# Patient Record
Sex: Female | Born: 1965 | Race: White | Hispanic: No | Marital: Married | Smoking: Current every day smoker
Health system: Southern US, Community
[De-identification: ages and names within clinical notes are randomized; demographics above are authoritative.]

## PROBLEM LIST (undated history)

## (undated) DIAGNOSIS — E785 Hyperlipidemia, unspecified: Secondary | ICD-10-CM

## (undated) DIAGNOSIS — N6310 Unspecified lump in the right breast, unspecified quadrant: Secondary | ICD-10-CM

## (undated) HISTORY — PX: KNEE SURGERY: SHX244

## (undated) HISTORY — DX: Hyperlipidemia, unspecified: E78.5

## (undated) HISTORY — PX: KNEE ARTHROSCOPY: SUR90

---

## 1996-01-22 HISTORY — PX: KNEE SURGERY: SHX244

## 1997-03-24 ENCOUNTER — Inpatient Hospital Stay (HOSPITAL_COMMUNITY): Admission: AD | Admit: 1997-03-24 | Discharge: 1997-03-24 | Payer: Self-pay | Admitting: Gynecology

## 1997-04-02 ENCOUNTER — Inpatient Hospital Stay (HOSPITAL_COMMUNITY): Admission: AD | Admit: 1997-04-02 | Discharge: 1997-04-05 | Payer: Self-pay | Admitting: Obstetrics and Gynecology

## 1997-04-06 ENCOUNTER — Encounter: Admission: RE | Admit: 1997-04-06 | Discharge: 1997-07-05 | Payer: Self-pay | Admitting: *Deleted

## 1997-05-24 ENCOUNTER — Other Ambulatory Visit: Admission: RE | Admit: 1997-05-24 | Discharge: 1997-05-24 | Payer: Self-pay | Admitting: Gynecology

## 1997-07-06 ENCOUNTER — Encounter (HOSPITAL_COMMUNITY): Admission: RE | Admit: 1997-07-06 | Discharge: 1997-10-04 | Payer: Self-pay | Admitting: *Deleted

## 1998-09-12 ENCOUNTER — Other Ambulatory Visit: Admission: RE | Admit: 1998-09-12 | Discharge: 1998-09-12 | Payer: Self-pay | Admitting: Gynecology

## 1998-10-20 ENCOUNTER — Other Ambulatory Visit: Admission: RE | Admit: 1998-10-20 | Discharge: 1998-10-20 | Payer: Self-pay | Admitting: Gynecology

## 1998-11-17 ENCOUNTER — Encounter: Admission: RE | Admit: 1998-11-17 | Discharge: 1998-11-17 | Payer: Self-pay | Admitting: *Deleted

## 1998-11-17 ENCOUNTER — Encounter: Payer: Self-pay | Admitting: *Deleted

## 1999-02-06 ENCOUNTER — Other Ambulatory Visit: Admission: RE | Admit: 1999-02-06 | Discharge: 1999-02-06 | Payer: Self-pay | Admitting: Gynecology

## 1999-08-29 ENCOUNTER — Emergency Department (HOSPITAL_COMMUNITY): Admission: EM | Admit: 1999-08-29 | Discharge: 1999-08-29 | Payer: Self-pay | Admitting: Internal Medicine

## 1999-09-05 ENCOUNTER — Encounter: Payer: Self-pay | Admitting: Internal Medicine

## 1999-09-05 ENCOUNTER — Emergency Department (HOSPITAL_COMMUNITY): Admission: EM | Admit: 1999-09-05 | Discharge: 1999-09-05 | Payer: Self-pay | Admitting: Emergency Medicine

## 2000-02-29 ENCOUNTER — Other Ambulatory Visit: Admission: RE | Admit: 2000-02-29 | Discharge: 2000-02-29 | Payer: Self-pay | Admitting: Gynecology

## 2001-05-21 ENCOUNTER — Other Ambulatory Visit: Admission: RE | Admit: 2001-05-21 | Discharge: 2001-05-21 | Payer: Self-pay | Admitting: Gynecology

## 2003-05-02 ENCOUNTER — Other Ambulatory Visit: Admission: RE | Admit: 2003-05-02 | Discharge: 2003-05-02 | Payer: Self-pay | Admitting: Gynecology

## 2004-05-15 ENCOUNTER — Other Ambulatory Visit: Admission: RE | Admit: 2004-05-15 | Discharge: 2004-05-15 | Payer: Self-pay | Admitting: Gynecology

## 2005-08-22 ENCOUNTER — Other Ambulatory Visit: Admission: RE | Admit: 2005-08-22 | Discharge: 2005-08-22 | Payer: Self-pay | Admitting: Gynecology

## 2013-08-13 ENCOUNTER — Other Ambulatory Visit: Payer: Self-pay | Admitting: Obstetrics & Gynecology

## 2013-08-13 DIAGNOSIS — Z1231 Encounter for screening mammogram for malignant neoplasm of breast: Secondary | ICD-10-CM

## 2013-08-19 ENCOUNTER — Ambulatory Visit (INDEPENDENT_AMBULATORY_CARE_PROVIDER_SITE_OTHER): Payer: 59

## 2013-08-19 DIAGNOSIS — Z1231 Encounter for screening mammogram for malignant neoplasm of breast: Secondary | ICD-10-CM

## 2013-09-02 ENCOUNTER — Ambulatory Visit (INDEPENDENT_AMBULATORY_CARE_PROVIDER_SITE_OTHER): Payer: 59 | Admitting: Obstetrics & Gynecology

## 2013-09-02 ENCOUNTER — Encounter: Payer: Self-pay | Admitting: Obstetrics & Gynecology

## 2013-09-02 VITALS — BP 113/72 | HR 85 | Resp 16 | Ht 62.0 in | Wt 140.0 lb

## 2013-09-02 DIAGNOSIS — Z124 Encounter for screening for malignant neoplasm of cervix: Secondary | ICD-10-CM

## 2013-09-02 DIAGNOSIS — Z1151 Encounter for screening for human papillomavirus (HPV): Secondary | ICD-10-CM

## 2013-09-02 DIAGNOSIS — Z01419 Encounter for gynecological examination (general) (routine) without abnormal findings: Secondary | ICD-10-CM

## 2013-09-02 DIAGNOSIS — Z0000 Encounter for general adult medical examination without abnormal findings: Secondary | ICD-10-CM

## 2013-09-02 NOTE — Progress Notes (Signed)
Subjective:    Catherine Cantrell is a 48 y.o. MW P2(28 and 31 yo sons) female who presents for an annual exam. The patient has no complaints today. The patient is sexually active. GYN screening history: last pap: was normal. The patient wears seatbelts: yes. The patient participates in regular exercise: yes. Has the patient ever been transfused or tattooed?: yes. (tattoo)  The patient reports that there is not domestic violence in her life.   Menstrual History: OB History   Grav Para Term Preterm Abortions TAB SAB Ect Mult Living   3 2   1 1    2       Menarche age: 11  Patient's last menstrual period was 08/19/2013.    The following portions of the patient's history were reviewed and updated as appropriate: allergies, current medications, past family history, past medical history, past social history, past surgical history and problem list.  Review of Systems A comprehensive review of systems was negative.  Married for 20 years, some new onset dryness/roughness with sex, using astroglide. Her husband has had a vasectomy and has bladder cancer and so sex is rare. Mammogram normal and utd. 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)   Objective:    BP 113/72  Pulse 85  Resp 16  Ht 5\' 2"  (1.575 m)  Wt 140 lb (63.504 kg)  BMI 25.60 kg/m2  LMP 08/19/2013  General Appearance:    Alert, cooperative, no distress, appears stated age  Head:    Normocephalic, without obvious abnormality, atraumatic  Eyes:    PERRL, conjunctiva/corneas clear, EOM's intact, fundi    benign, both eyes  Ears:    Normal TM's and external ear canals, both ears  Nose:   Nares normal, septum midline, mucosa normal, no drainage    or sinus tenderness  Throat:   Lips, mucosa, and tongue normal; teeth and gums normal  Neck:   Supple, symmetrical, trachea midline, no adenopathy;    thyroid:  no enlargement/tenderness/nodules; no carotid   bruit or JVD  Back:     Symmetric, no curvature, ROM normal, no CVA tenderness   Lungs:     Clear to auscultation bilaterally, respirations unlabored  Chest Wall:    No tenderness or deformity   Heart:    Regular rate and rhythm, S1 and S2 normal, no murmur, rub   or gallop  Breast Exam:    No tenderness, masses, or nipple abnormality  Abdomen:     Soft, non-tender, bowel sounds active all four quadrants,    no masses, no organomegaly  Genitalia:    Normal female without lesion, discharge or tenderness, hypo and hyperpigmented areas of vulva (She says that this is unchanged for 15 years and has had multiple biopsies), NSSR, NT, mobile, normal adnexal exam     Extremities:   Extremities normal, atraumatic, no cyanosis or edema  Pulses:   2+ and symmetric all extremities  Skin:   Skin color, texture, turgor normal, no rashes or lesions  Lymph nodes:   Cervical, supraclavicular, and axillary nodes normal  Neurologic:   CNII-XII intact, normal strength, sensation and reflexes    throughout  .    Assessment:    Healthy female exam.    Plan:     Breast self exam technique reviewed and patient encouraged to perform self-exam monthly. Thin prep Pap smear.  Refer to [facility] for screening labs

## 2013-09-06 LAB — PAP LB, RFX HPV ALL PTH: PAP Smear Comment: 0

## 2013-09-08 ENCOUNTER — Telehealth: Payer: Self-pay | Admitting: *Deleted

## 2013-09-08 NOTE — Telephone Encounter (Signed)
LMOVM of neg pap smear.

## 2013-11-22 ENCOUNTER — Encounter: Payer: Self-pay | Admitting: Obstetrics & Gynecology

## 2013-11-24 DIAGNOSIS — E785 Hyperlipidemia, unspecified: Secondary | ICD-10-CM | POA: Insufficient documentation

## 2014-09-30 ENCOUNTER — Other Ambulatory Visit (HOSPITAL_COMMUNITY): Payer: Self-pay | Admitting: Obstetrics & Gynecology

## 2014-09-30 DIAGNOSIS — Z1231 Encounter for screening mammogram for malignant neoplasm of breast: Secondary | ICD-10-CM

## 2014-10-11 ENCOUNTER — Ambulatory Visit: Payer: Self-pay | Admitting: Obstetrics & Gynecology

## 2014-10-13 ENCOUNTER — Ambulatory Visit (INDEPENDENT_AMBULATORY_CARE_PROVIDER_SITE_OTHER): Payer: 59 | Admitting: Obstetrics & Gynecology

## 2014-10-13 ENCOUNTER — Ambulatory Visit (INDEPENDENT_AMBULATORY_CARE_PROVIDER_SITE_OTHER): Payer: 59

## 2014-10-13 ENCOUNTER — Encounter: Payer: Self-pay | Admitting: Obstetrics & Gynecology

## 2014-10-13 VITALS — BP 129/80 | HR 81 | Resp 16 | Ht 62.5 in | Wt 148.0 lb

## 2014-10-13 DIAGNOSIS — Z1151 Encounter for screening for human papillomavirus (HPV): Secondary | ICD-10-CM

## 2014-10-13 DIAGNOSIS — Z01419 Encounter for gynecological examination (general) (routine) without abnormal findings: Secondary | ICD-10-CM | POA: Diagnosis not present

## 2014-10-13 DIAGNOSIS — N938 Other specified abnormal uterine and vaginal bleeding: Secondary | ICD-10-CM

## 2014-10-13 DIAGNOSIS — N898 Other specified noninflammatory disorders of vagina: Secondary | ICD-10-CM

## 2014-10-13 DIAGNOSIS — Z124 Encounter for screening for malignant neoplasm of cervix: Secondary | ICD-10-CM | POA: Diagnosis not present

## 2014-10-13 DIAGNOSIS — L298 Other pruritus: Secondary | ICD-10-CM | POA: Diagnosis not present

## 2014-10-13 DIAGNOSIS — Z1231 Encounter for screening mammogram for malignant neoplasm of breast: Secondary | ICD-10-CM | POA: Diagnosis not present

## 2014-10-13 MED ORDER — VARENICLINE TARTRATE 0.5 MG PO TABS: 0.5000 mg | ORAL_TABLET | Freq: Two times a day (BID) | ORAL | Status: DC

## 2014-10-13 NOTE — Progress Notes (Signed)
Subjective:    Catherine Cantrell is a 49 y.o. MW  female who presents for an annual exam. She reports 2 periods per month and some vaginal itching. The patient is rarely sexually active. GYN screening history: last pap: was normal. The patient wears seatbelts: yes. The patient participates in regular exercise: yes. Has the patient ever been transfused or tattooed?: yes. The patient reports that there is not domestic violence in her life.   Menstrual History: OB History    Gravida Para Term Preterm AB TAB SAB Ectopic Multiple Living   3 2   1 1    2       Menarche age: 89  Patient's last menstrual period was 09/29/2014.    The following portions of the patient's history were reviewed and updated as appropriate: allergies, current medications, past family history, past medical history, past social history, past surgical history and problem list.  Review of Systems A comprehensive review of systems was negative.  Married for 21 years.    Objective:    BP 129/80 mmHg  Pulse 81  Resp 16  Ht 5' 2.5" (1.588 m)  Wt 148 lb (67.132 kg)  BMI 26.62 kg/m2  LMP 09/29/2014  General Appearance:    Alert, cooperative, no distress, appears stated age  Head:    Normocephalic, without obvious abnormality, atraumatic  Eyes:    PERRL, conjunctiva/corneas clear, EOM's intact, fundi    benign, both eyes  Ears:    Normal TM's and external ear canals, both ears  Nose:   Nares normal, septum midline, mucosa normal, no drainage    or sinus tenderness  Throat:   Lips, mucosa, and tongue normal; teeth and gums normal  Neck:   Supple, symmetrical, trachea midline, no adenopathy;    thyroid:  no enlargement/tenderness/nodules; no carotid   bruit or JVD  Back:     Symmetric, no curvature, ROM normal, no CVA tenderness  Lungs:     Clear to auscultation bilaterally, respirations unlabored  Chest Wall:    No tenderness or deformity   Heart:    Regular rate and rhythm, S1 and S2 normal, no murmur, rub   or  gallop  Breast Exam:    No tenderness, masses, or nipple abnormality  Abdomen:     Soft, non-tender, bowel sounds active all four quadrants,    no masses, no organomegaly  Genitalia:    Normal female without lesion, discharge or tenderness, NSSA, NT, mobile, normal adnexal exam     Extremities:   Extremities normal, atraumatic, no cyanosis or edema  Pulses:   2+ and symmetric all extremities  Skin:   Skin color, texture, turgor normal, no rashes or lesions  Lymph nodes:   Cervical, supraclavicular, and axillary nodes normal  Neurologic:   CNII-XII intact, normal strength, sensation and reflexes    throughout  .    Assessment:    Healthy female exam.   She wants to restart Chantix to help stop smoking DUB Vaginal itching   Plan:     Breast self exam technique reviewed and patient encouraged to perform self-exam monthly. Thin prep Pap smear. with HPV Mammogram today Chantix (offered referred to [facility]) TSH and gyn u/s Wet prep

## 2014-10-14 LAB — TSH: TSH: 1.8 u[IU]/mL (ref 0.450–4.500)

## 2014-10-17 ENCOUNTER — Ambulatory Visit (INDEPENDENT_AMBULATORY_CARE_PROVIDER_SITE_OTHER): Payer: 59

## 2014-10-17 DIAGNOSIS — N938 Other specified abnormal uterine and vaginal bleeding: Secondary | ICD-10-CM

## 2014-10-17 LAB — WET PREP, GENITAL

## 2014-10-18 LAB — PAP IG AND HPV HIGH-RISK
HPV low volume reflex: NEGATIVE
PAP Smear Comment: 0

## 2014-11-16 ENCOUNTER — Emergency Department
Admission: EM | Admit: 2014-11-16 | Discharge: 2014-11-16 | Disposition: A | Discharge status: Home / Self Care | Payer: 59 | Source: Home / Self Care | Attending: Family Medicine | Admitting: Family Medicine

## 2014-11-16 ENCOUNTER — Encounter: Payer: Self-pay | Admitting: Emergency Medicine

## 2014-11-16 ENCOUNTER — Emergency Department (INDEPENDENT_AMBULATORY_CARE_PROVIDER_SITE_OTHER): Payer: 59

## 2014-11-16 DIAGNOSIS — J209 Acute bronchitis, unspecified: Secondary | ICD-10-CM | POA: Diagnosis not present

## 2014-11-16 DIAGNOSIS — R05 Cough: Secondary | ICD-10-CM | POA: Diagnosis not present

## 2014-11-16 DIAGNOSIS — R0989 Other specified symptoms and signs involving the circulatory and respiratory systems: Secondary | ICD-10-CM

## 2014-11-16 DIAGNOSIS — F17200 Nicotine dependence, unspecified, uncomplicated: Secondary | ICD-10-CM | POA: Diagnosis not present

## 2014-11-16 MED ORDER — BENZONATATE 100 MG PO CAPS: 100.0000 mg | ORAL_CAPSULE | Freq: Three times a day (TID) | ORAL | Status: DC

## 2014-11-16 MED ORDER — PREDNISONE 20 MG PO TABS: ORAL_TABLET | ORAL | Status: DC

## 2014-11-16 MED ORDER — AZITHROMYCIN 250 MG PO TABS: 250.0000 mg | ORAL_TABLET | Freq: Every day | ORAL | Status: DC

## 2014-11-16 MED ORDER — ALBUTEROL SULFATE HFA 108 (90 BASE) MCG/ACT IN AERS: 1.0000 | INHALATION_SPRAY | Freq: Four times a day (QID) | RESPIRATORY_TRACT | Status: DC | PRN

## 2014-11-16 MED ORDER — HYDROCODONE-HOMATROPINE 5-1.5 MG/5ML PO SYRP: 5.0000 mL | ORAL_SOLUTION | Freq: Four times a day (QID) | ORAL | Status: DC | PRN

## 2014-11-16 NOTE — ED Notes (Signed)
6 days ago started with body aches, cough, night sweats, labored breathing, headache

## 2014-11-16 NOTE — Discharge Instructions (Signed)
You may take 400-600mg  Ibuprofen (Motrin) every 6-8 hours for fever and pain  Alternate with Tylenol  You may take 500mg  Tylenol every 4-6 hours as needed for fever and pain  Follow-up with your primary care provider next week for recheck of symptoms if not improving.  Be sure to drink plenty of fluids and rest, at least 8hrs of sleep a night, preferably more while you are sick. Return urgent care or go to closest ER if you cannot keep down fluids/signs of dehydration, fever not reducing with Tylenol, difficulty breathing/wheezing, stiff neck, worsening condition, or other concerns (see below)   Acute Bronchitis Bronchitis is inflammation of the airways that extend from the windpipe into the lungs (bronchi). The inflammation often causes mucus to develop. This leads to a cough, which is the most common symptom of bronchitis.  In acute bronchitis, the condition usually develops suddenly and goes away over time, usually in a couple weeks. Smoking, allergies, and asthma can make bronchitis worse. Repeated episodes of bronchitis may cause further lung problems.  CAUSES Acute bronchitis is most often caused by the same virus that causes a cold. The virus can spread from person to person (contagious) through coughing, sneezing, and touching contaminated objects. SIGNS AND SYMPTOMS   Cough.   Fever.   Coughing up mucus.   Body aches.   Chest congestion.   Chills.   Shortness of breath.   Sore throat.  DIAGNOSIS  Acute bronchitis is usually diagnosed through a physical exam. Your health care provider will also ask you questions about your medical history. Tests, such as chest X-rays, are sometimes done to rule out other conditions.  TREATMENT  Acute bronchitis usually goes away in a couple weeks. Oftentimes, no medical treatment is necessary. Medicines are sometimes given for relief of fever or cough. Antibiotic medicines are usually not needed but may be prescribed in certain  situations. In some cases, an inhaler may be recommended to help reduce shortness of breath and control the cough. A cool mist vaporizer may also be used to help thin bronchial secretions and make it easier to clear the chest.  HOME CARE INSTRUCTIONS  Get plenty of rest.   Drink enough fluids to keep your urine clear or pale yellow (unless you have a medical condition that requires fluid restriction). Increasing fluids may help thin your respiratory secretions (sputum) and reduce chest congestion, and it will prevent dehydration.   Take medicines only as directed by your health care provider.  If you were prescribed an antibiotic medicine, finish it all even if you start to feel better.  Avoid smoking and secondhand smoke. Exposure to cigarette smoke or irritating chemicals will make bronchitis worse. If you are a smoker, consider using nicotine gum or skin patches to help control withdrawal symptoms. Quitting smoking will help your lungs heal faster.   Reduce the chances of another bout of acute bronchitis by washing your hands frequently, avoiding people with cold symptoms, and trying not to touch your hands to your mouth, nose, or eyes.   Keep all follow-up visits as directed by your health care provider.  SEEK MEDICAL CARE IF: Your symptoms do not improve after 1 week of treatment.  SEEK IMMEDIATE MEDICAL CARE IF:  You develop an increased fever or chills.   You have chest pain.   You have severe shortness of breath.  You have bloody sputum.   You develop dehydration.  You faint or repeatedly feel like you are going to pass out.  You  develop repeated vomiting.  You develop a severe headache. MAKE SURE YOU:   Understand these instructions.  Will watch your condition.  Will get help right away if you are not doing well or get worse.   This information is not intended to replace advice given to you by your health care provider. Make sure you discuss any questions  you have with your health care provider.   Document Released: 02/15/2004 Document Revised: 01/28/2014 Document Reviewed: 06/30/2012 Elsevier Interactive Patient Education [employer].

## 2014-11-16 NOTE — ED Provider Notes (Signed)
CSN: [id number]     Arrival date & time 11/16/14  0909 History   First MD Initiated Contact with Patient 11/16/14 0912     Chief Complaint  Patient presents with  . Cough   (Consider location/radiation/quality/duration/timing/severity/associated sxs/prior Treatment) HPI Pt is a 49yo female presenting to [facility] with c/o gradually worsening moderately intermittent non-productive cough with associated body aches, night sweats, shortness of breath and generalized headaches for 6 days.  Pt states she has been using Nyquil but feels fatigued throughout the day and has little to no appetite. Pt also reports subjective fever.  Her son and husband were also sick but had more sinus congestion than cough.  Pt is a daily cigarette smoker of 30 years.  Denies hx of asthma.  Denies recent travel. Denies chest pain. Denies leg pain or swelling. No hx of blood clots.   Past Medical History  Diagnosis Date  . Hyperlipidemia    Past Surgical History  Procedure Laterality Date  . Cesarean section    . Knee surgery     Family History  Problem Relation Age of Onset  . Breast cancer Maternal Aunt   . Breast cancer Paternal Grandmother   . Diabetes Maternal Grandmother   . Diabetes Father    Social History  Substance Use Topics  . Smoking status: Current Every Day Smoker -- 30 years  . Smokeless tobacco: Never Used  . Alcohol Use: 0.5 oz/week    1 Standard drinks or equivalent per week   OB History    Gravida Para Term Preterm AB TAB SAB Ectopic Multiple Living   3 2   1 1    2      Review of Systems  Constitutional: Positive for fever, chills, appetite change and fatigue.  HENT: Positive for congestion and ear pain ( Right). Negative for sore throat, trouble swallowing and voice change.   Respiratory: Positive for cough, chest tightness, shortness of breath and wheezing.   Cardiovascular: Negative for chest pain and palpitations.  Gastrointestinal: Positive for nausea. Negative for vomiting,  abdominal pain and diarrhea.  Musculoskeletal: Positive for myalgias and arthralgias. Negative for back pain.  Skin: Negative for rash.  Neurological: Positive for headaches. Negative for dizziness and light-headedness.    Allergies  Amoxicillin and Sulfa antibiotics  Home Medications   Prior to Admission medications   Medication Sig Start Date End Date Taking? Authorizing Provider  albuterol (PROVENTIL HFA;VENTOLIN HFA) 108 (90 BASE) MCG/ACT inhaler Inhale 1-2 puffs into the lungs every 6 (six) hours as needed for wheezing or shortness of breath. 11/16/14   Noland Fordyce, PA-C  azithromycin (ZITHROMAX) 250 MG tablet Take 1 tablet (250 mg total) by mouth daily. Take first 2 tablets together, then 1 every day until finished. 11/16/14   Noland Fordyce, PA-C  benzonatate (TESSALON) 100 MG capsule Take 1 capsule (100 mg total) by mouth every 8 (eight) hours. 11/16/14   Noland Fordyce, PA-C  HYDROcodone-homatropine (HYCODAN) 5-1.5 MG/5ML syrup Take 5 mLs by mouth every 6 (six) hours as needed for cough. 11/16/14   Noland Fordyce, PA-C  predniSONE (DELTASONE) 20 MG tablet 3 tabs po day one, then 2 po daily x 4 days 11/16/14   Noland Fordyce, PA-C  varenicline (CHANTIX) 0.5 MG tablet Take 1 tablet (0.5 mg total) by mouth 2 (two) times daily. 10/13/14   Emily Filbert, MD   Meds Ordered and Administered this Visit  Medications - No data to display  BP 115/77 mmHg  Pulse 86  Temp(Src) 98.3  F (36.8 C) (Oral)  Ht 5\' 2"  (1.575 m)  Wt 144 lb (65.318 kg)  BMI 26.33 kg/m2  SpO2 100% No data found.   Physical Exam  Constitutional: She appears well-developed and well-nourished. No distress.  HENT:  Head: Normocephalic and atraumatic.  Right Ear: Hearing, tympanic membrane, external ear and ear canal normal.  Left Ear: Hearing, tympanic membrane, external ear and ear canal normal.  Nose: Nose normal.  Mouth/Throat: Uvula is midline and mucous membranes are normal. Posterior oropharyngeal erythema  present. No oropharyngeal exudate, posterior oropharyngeal edema or tonsillar abscesses.  Eyes: Conjunctivae are normal. No scleral icterus.  Neck: Normal range of motion. Neck supple.  Cardiovascular: Normal rate, regular rhythm and normal heart sounds.   Pulmonary/Chest: Effort normal. No respiratory distress. She has wheezes. She has no rales. She exhibits no tenderness.  No respiratory distress, able to speak in full sentences w/o difficulty. Expiratory wheeze in all lung fields.  Hacking dry cough during exam  Abdominal: Soft. Bowel sounds are normal. She exhibits no distension and no mass. There is no tenderness. There is no rebound and no guarding.  Musculoskeletal: Normal range of motion.  Neurological: She is alert.  Skin: Skin is warm and dry. She is not diaphoretic.  Nursing note and vitals reviewed.   ED Course  Procedures (including critical care time)  Labs Review Labs Reviewed - No data to display  Imaging Review Dg Chest 2 View  11/16/2014  CLINICAL DATA:  49 year old female with cough and congestion for 4 days. Initial encounter. Smoker. EXAM: CHEST  2 VIEW COMPARISON:  None. FINDINGS: Lung volumes are within normal limits. Normal cardiac size and mediastinal contours. Visualized tracheal air column is within normal limits. No pneumothorax, pulmonary edema, pleural effusion or confluent pulmonary opacity. Mildly increased interstitial markings throughout both lungs. No acute osseous abnormality identified. IMPRESSION: No acute cardiopulmonary abnormality. Mildly increased pulmonary interstitial markings likely reflect history of smoking. Electronically Signed   By: Genevie Ann M.D.   On: 11/16/2014 09:41     MDM   1. Acute bronchitis, unspecified organism     Pt c/o worsening cough with flu-like symptoms. O2 Sat 100% on RA. Vitals: WNL CXR: no pneumonia, pneumothorax, pulmonary edema or pleural effusion, however there is mildly increased pulmonary interstitial markings  likely due to hx of smoking.  Will tx with azithromycin as pt will be started on prednisone for 5 days. Rx: azithromycin, prednisone, albuterol inhaler, tessalon, and hycodan. Discussed risks of drowsiness with hycodan. Advised not to drink alcohol or drive while taking. F/u with PCP in 1 week if not improving, sooner if worsening. Patient verbalized understanding and agreement with treatment plan.    Noland Fordyce, PA-C 11/16/14 1021

## 2014-11-20 ENCOUNTER — Telehealth: Payer: Self-pay | Admitting: Emergency Medicine

## 2014-11-22 ENCOUNTER — Ambulatory Visit (INDEPENDENT_AMBULATORY_CARE_PROVIDER_SITE_OTHER): Payer: 59 | Admitting: Osteopathic Medicine

## 2014-11-22 VITALS — BP 126/70 | HR 70 | Temp 97.5°F | Wt 148.5 lb

## 2014-11-22 DIAGNOSIS — Z23 Encounter for immunization: Secondary | ICD-10-CM

## 2014-11-22 DIAGNOSIS — Z0000 Encounter for general adult medical examination without abnormal findings: Secondary | ICD-10-CM

## 2014-11-22 DIAGNOSIS — Z5181 Encounter for therapeutic drug level monitoring: Secondary | ICD-10-CM | POA: Diagnosis not present

## 2014-11-22 DIAGNOSIS — R946 Abnormal results of thyroid function studies: Secondary | ICD-10-CM | POA: Diagnosis not present

## 2014-11-22 DIAGNOSIS — Z13220 Encounter for screening for lipoid disorders: Secondary | ICD-10-CM

## 2014-11-22 NOTE — Progress Notes (Signed)
HPI: Catherine Cantrell (Note: goes by Catherine Cantrell) is a 49 y.o. female who presents to [place]  today for chief complaint of:  Chief Complaint  Patient presents with  . New Patient (Initial Visit)   . No complaints. On no medications. See below for preventive care. GYN care at another clinic. No concerns today. Is a [employer] employee so gets her labs done there, limited labs available though she states cholesterol and blood counts were fine a month ago, cholesterol historically has been high. Reports history of abnormal TSH though last TSH was normal when drawn by OBGYN.    Past medical, social and family history reviewed: Past Medical History  Diagnosis Date  . Hyperlipidemia    Past Surgical History  Procedure Laterality Date  . Cesarean section    . Knee surgery     Social History  Substance Use Topics  . Smoking status: Current Every Day Smoker -- 30 years  . Smokeless tobacco: Never Used  . Alcohol Use: 0.5 oz/week    1 Standard drinks or equivalent per week   Family History  Problem Relation Age of Onset  . Breast cancer Maternal Aunt   . Breast cancer Paternal Grandmother   . Diabetes Maternal Grandmother   . Diabetes Father     No current outpatient prescriptions on file.   No current facility-administered medications for this visit.   Allergies  Allergen Reactions  . Amoxicillin Hives  . Sulfa Antibiotics Hives      Review of Systems: CONSTITUTIONAL:  No  fever, no chills, No  unintentional weight changes HEAD/EYES/EARS/NOSE/THROAT: No headache, no vision change, no hearing change, No  sore throat CARDIAC: No chest pain, no pressure/palpitations, no orthopnea RESPIRATORY: No  cough, No  shortness of breath/wheeze GASTROINTESTINAL: No nausea, no vomiting, no abdominal pain, no blood in stool, no diarrhea, no constipation MUSCULOSKELETAL: No  myalgia/arthralgia GENITOURINARY: No incontinence, No abnormal genital  bleeding/discharge SKIN: No rash/wounds/concerning lesions HEM/ONC: No easy bruising/bleeding, no abnormal lymph node ENDOCRINE: No polyuria/polydipsia/polyphagia, no heat/cold intolerance  NEUROLOGIC: No weakness, no dizziness, no slurred speech PSYCHIATRIC: No concerns with depression, no concerns with anxiety, no sleep problems    Exam:  BP 126/70 mmHg  Pulse 70  Temp(Src) 97.5 F (36.4 C)  Wt 148 lb 8 oz (67.359 kg) Constitutional: VSS, see above. General Appearance: alert, well-developed, well-nourished, NAD Eyes: Normal lids and conjunctive, non-icteric sclera, PERRLA Ears, Nose, Mouth, Throat: Normal external inspection ears/nares/mouth/lips/gums, TM normal bilaterally, MMM, posterior pharynx No  erythema No  exudate Neck: No masses, trachea midline. No thyroid enlargement/tenderness/mass appreciated. No lymphadenopathy Respiratory: Normal respiratory effort. no wheeze, no rhonchi, no rales Cardiovascular: S1/S2 normal, no murmur, no rub/gallop auscultated. RRR. No lower extremity edema. Gastrointestinal: Nontender, no masses. No hepatomegaly, no splenomegaly. No hernia appreciated. Bowel sounds normal. Rectal exam deferred.  Musculoskeletal: Gait normal. No clubbing/cyanosis of digits.  Neurological: No cranial nerve deficit on limited exam. Motor and sensation intact and symmetric Psychiatric: Normal judgment/insight. Normal mood and affect. Oriented x3.    No results found for this or any previous visit (from the past 72 hour(s)).    ASSESSMENT/PLAN:  Encounter for annual physical exam  Lipid screening - Plan: Lipid panel  Encounter for medication monitoring - Plan: COMPLETE METABOLIC PANEL WITH GFR  Abnormal results of thyroid function studies - per patient, though most recent labs normal - Plan: TSH  Influenza vaccine needed - patient declined     Educated on importance of flu vaccine but  patient declines.  Return in about 6 months (around 05/22/2015), or any  concerns, for recheck cholesterol, followup thyroid concerns.    FEMALE PREVENTIVE CARE  ANNUAL SCREENING/COUNSELING Tobacco - Current  vaping about 6 mos, hx smoking x 30 year 1ppd  Alcohol - social drinker Diet/Exercise - HEALTHY HABITS DISCUSSED TO DECREASE CV RISK, working on weight loss, eating more proteins and reducing calories Sexual Health - Yes with female. STI - The patient denies history of sexually transmitted disease. INTERESTED IN STI TESTING - no Depression - PQH2 Negative Domestic violence concerns - no HTN SCREENING - SEE VITALS Vaccination status - SEE BELOW  INFECTIOUS DISEASE SCREENING HIV - all adults 15-65 - does not need GC/CT - sexually active - does not need HepC - born 72-1965 - does not need TB - if risk/required by employer - does not need  DISEASE SCREENING Lipid - (Low risk screen M35/F45; High risk screen M25/F35 if HTN, Tob, FH CHD M<55/F<65) - needs in 6 mos repeat DM2 (45+ or Risk = FH 1st deg DM, Hx GDM, overweight/sedentary, high-risk ethnicity, HTN) - does not need Osteoporosis - age 12+ or one sooner if risk - does not need NOTE: Got labs through [facility],   CANCER SCREENING Cervical - Pap q3 yr age 33+, Pap + HPV q5y age 70+ - PAP - does not need - GYN (DR DOVE) Breast - Mammo age 67+ (C) and biennial age 68-75 (A) - 52 - does not need GYN (DR DOVE) Lung - annual low dose CT Chest age 29-75 w/ 30+ PY, current/quit past 15 years - CT - does not need now but consider at age 66 due to 38 pack year and at 50 will have quit <15 years prior Colon - age 43+ or 49 years of age prior to [place] Dx - GI REFERRAL - does not need  ADULT VACCINATION Influenza - annual - was offered and declined by the patient Td booster every 10 years - was not indicated - given <10 years ago after an accident  HPV - age <61yo - was not indicated Zoster - age 38+ - was not indicated, never had chickenpox but CDC recommends vaccination at 85-60 regardless of zoster  history Pneumonia - age 60+ sooner if risk (DM, smoker, other) - not needed  OTHER Fall - exercise and Vit D age 62+ - does not need Consider ASA - age 33-59 - does not need

## 2014-11-25 ENCOUNTER — Encounter: Payer: 59 | Admitting: Osteopathic Medicine

## 2015-09-19 ENCOUNTER — Other Ambulatory Visit (HOSPITAL_COMMUNITY): Payer: Self-pay | Admitting: Obstetrics & Gynecology

## 2015-09-19 DIAGNOSIS — Z1231 Encounter for screening mammogram for malignant neoplasm of breast: Secondary | ICD-10-CM

## 2015-11-01 ENCOUNTER — Encounter: Payer: Self-pay | Admitting: Obstetrics & Gynecology

## 2015-11-01 ENCOUNTER — Ambulatory Visit (INDEPENDENT_AMBULATORY_CARE_PROVIDER_SITE_OTHER): Payer: 59

## 2015-11-01 ENCOUNTER — Ambulatory Visit (INDEPENDENT_AMBULATORY_CARE_PROVIDER_SITE_OTHER): Payer: 59 | Admitting: Obstetrics & Gynecology

## 2015-11-01 VITALS — Ht 62.0 in | Wt 142.0 lb

## 2015-11-01 DIAGNOSIS — Z124 Encounter for screening for malignant neoplasm of cervix: Secondary | ICD-10-CM | POA: Diagnosis not present

## 2015-11-01 DIAGNOSIS — Z1151 Encounter for screening for human papillomavirus (HPV): Secondary | ICD-10-CM | POA: Diagnosis not present

## 2015-11-01 DIAGNOSIS — Z1231 Encounter for screening mammogram for malignant neoplasm of breast: Secondary | ICD-10-CM | POA: Diagnosis not present

## 2015-11-01 DIAGNOSIS — Z01419 Encounter for gynecological examination (general) (routine) without abnormal findings: Secondary | ICD-10-CM | POA: Diagnosis not present

## 2015-11-01 DIAGNOSIS — Z113 Encounter for screening for infections with a predominantly sexual mode of transmission: Secondary | ICD-10-CM

## 2015-11-01 MED ORDER — VALACYCLOVIR HCL 1 G PO TABS: 1000.0000 mg | ORAL_TABLET | Freq: Every day | ORAL | 12 refills | Status: DC

## 2015-11-01 MED ORDER — ZOLPIDEM TARTRATE 10 MG PO TABS: 5.0000 mg | ORAL_TABLET | Freq: Every evening | ORAL | 3 refills | Status: DC | PRN

## 2015-11-01 NOTE — Progress Notes (Signed)
Subjective:    Catherine Cantrell is a 50 y.o. P2 (80 and 2 yo sons) female who presents for an annual exam. The patient has no complaints today. She needs labs and STI testing. The patient is sexually active. GYN screening history: last pap: was normal. The patient wears seatbelts: yes. The patient participates in regular exercise: yes. Has the patient ever been transfused or tattooed?: no. The patient reports that there is not domestic violence in her life.   Menstrual History: OB History    Gravida Para Term Preterm AB Living   3 2     1 2    SAB TAB Ectopic Multiple Live Births     1            Menarche age: 34 Patient's last menstrual period was 10/17/2015 (approximate).    The following portions of the patient's history were reviewed and updated as appropriate: allergies, current medications, past family history, past medical history, past social history, past surgical history and problem list.  Review of Systems Pertinent items are noted in HPI.   Now vaping instead of smoking. Still working at [employer] for 22 years but husband with stage 4 bladder cancer (bladder/prostate to be removed) Denies dysparuenia No menopausal symptoms Periods monthly New partner for the last month or so Planning counseling   Objective:    Ht 5\' 2"  (1.575 m)   Wt 142 lb (64.4 kg)   LMP 10/17/2015 (Approximate)   BMI 25.97 kg/m   General Appearance:    Alert, cooperative, no distress, appears stated age  Head:    Normocephalic, without obvious abnormality, atraumatic  Eyes:    PERRL, conjunctiva/corneas clear, EOM's intact, fundi    benign, both eyes  Ears:    Normal TM's and external ear canals, both ears  Nose:   Nares normal, septum midline, mucosa normal, no drainage    or sinus tenderness  Throat:   Lips, mucosa, and tongue normal; teeth and gums normal  Neck:   Supple, symmetrical, trachea midline, no adenopathy;    thyroid:  no enlargement/tenderness/nodules; no  carotid   bruit or JVD  Back:     Symmetric, no curvature, ROM normal, no CVA tenderness  Lungs:     Clear to auscultation bilaterally, respirations unlabored  Chest Wall:    No tenderness or deformity   Heart:    Regular rate and rhythm, S1 and S2 normal, no murmur, rub   or gallop  Breast Exam:    No tenderness, masses, or nipple abnormality  Abdomen:     Soft, non-tender, bowel sounds active all four quadrants,    no masses, no organomegaly  Genitalia:    Normal female without lesion, discharge or tenderness, hypo and hyperpigmentation of vulva (She reports many biopsies, all negative), NSSR, NT, mobile, normal adnexal exam     Extremities:   Extremities normal, atraumatic, no cyanosis or edema  Pulses:   2+ and symmetric all extremities  Skin:   Skin color, texture, turgor normal, no rashes or lesions  Lymph nodes:   Cervical, supraclavicular, and axillary nodes normal  Neurologic:   CNII-XII intact, normal strength, sensation and reflexes    throughout   .    Assessment:    Healthy female exam.    Plan:     Chlamydia specimen. GC specimen. Thin prep Pap smear.  with cotesting Valtrex daily Fasting labs ambien prn

## 2015-11-01 NOTE — Addendum Note (Signed)
Addended by: Asencion Islam on: 11/01/2015 10:38 AM   Modules accepted: Orders

## 2015-11-02 LAB — COMPREHENSIVE METABOLIC PANEL WITH GFR
ALT: 15 IU/L (ref 0–32)
AST: 15 IU/L (ref 0–40)
Albumin/Globulin Ratio: 1.8 (ref 1.2–2.2)
Albumin: 4.6 g/dL (ref 3.5–5.5)
Alkaline Phosphatase: 65 IU/L (ref 39–117)
BUN/Creatinine Ratio: 15 (ref 9–23)
BUN: 12 mg/dL (ref 6–24)
Bilirubin Total: 0.6 mg/dL (ref 0.0–1.2)
CO2: 23 mmol/L (ref 18–29)
Calcium: 10 mg/dL (ref 8.7–10.2)
Chloride: 100 mmol/L (ref 96–106)
Creatinine, Ser: 0.79 mg/dL (ref 0.57–1.00)
GFR calc Af Amer: 101 mL/min/1.73
GFR calc non Af Amer: 88 mL/min/1.73
Globulin, Total: 2.5 g/dL (ref 1.5–4.5)
Glucose: 93 mg/dL (ref 65–99)
Potassium: 4.6 mmol/L (ref 3.5–5.2)
Sodium: 142 mmol/L (ref 134–144)
Total Protein: 7.1 g/dL (ref 6.0–8.5)

## 2015-11-02 LAB — HEPATITIS B SURFACE ANTIBODY,QUALITATIVE: Hep B Surface Ab, Qual: REACTIVE

## 2015-11-02 LAB — CBC
Hematocrit: 45.1 % (ref 34.0–46.6)
Hemoglobin: 15.6 g/dL (ref 11.1–15.9)
MCH: 33.5 pg — ABNORMAL HIGH (ref 26.6–33.0)
MCHC: 34.6 g/dL (ref 31.5–35.7)
MCV: 97 fL (ref 79–97)
Platelets: 311 x10E3/uL (ref 150–379)
RBC: 4.66 x10E6/uL (ref 3.77–5.28)
RDW: 13.4 % (ref 12.3–15.4)
WBC: 6.8 x10E3/uL (ref 3.4–10.8)

## 2015-11-02 LAB — HEPATITIS B SURFACE ANTIGEN: HEP B S AG: NEGATIVE

## 2015-11-02 LAB — VITAMIN D 25 HYDROXY (VIT D DEFICIENCY, FRACTURES): Vit D, 25-Hydroxy: 30.2 ng/mL (ref 30.0–100.0)

## 2015-11-02 LAB — FOLLICLE STIMULATING HORMONE: FSH: 10.5 m[IU]/mL

## 2015-11-02 LAB — HIV ANTIBODY (ROUTINE TESTING W REFLEX): HIV SCREEN 4TH GENERATION: NONREACTIVE

## 2015-11-02 LAB — HEPATITIS C ANTIBODY

## 2015-11-02 LAB — TSH: TSH: 1.31 u[IU]/mL (ref 0.450–4.500)

## 2015-11-02 LAB — SYPHILIS: RPR W/REFLEX TO RPR TITER AND TREPONEMAL ANTIBODIES, TRADITIONAL SCREENING AND DIAGNOSIS ALGORITHM: RPR Ser Ql: NONREACTIVE

## 2015-11-07 ENCOUNTER — Encounter: Payer: Self-pay | Admitting: *Deleted

## 2015-11-09 ENCOUNTER — Encounter: Payer: Self-pay | Admitting: *Deleted

## 2015-11-10 ENCOUNTER — Encounter: Payer: Self-pay | Admitting: Osteopathic Medicine

## 2015-11-10 ENCOUNTER — Ambulatory Visit (INDEPENDENT_AMBULATORY_CARE_PROVIDER_SITE_OTHER): Payer: 59 | Admitting: Osteopathic Medicine

## 2015-11-10 VITALS — BP 128/70 | HR 76 | Ht 62.0 in | Wt 146.0 lb

## 2015-11-10 DIAGNOSIS — F17200 Nicotine dependence, unspecified, uncomplicated: Secondary | ICD-10-CM

## 2015-11-10 MED ORDER — VARENICLINE TARTRATE 1 MG PO TABS: 1.0000 mg | ORAL_TABLET | Freq: Two times a day (BID) | ORAL | 1 refills | Status: DC

## 2015-11-10 MED ORDER — VARENICLINE TARTRATE 0.5 MG X 11 & 1 MG X 42 PO MISC: ORAL | 0 refills | Status: DC

## 2015-11-10 NOTE — Progress Notes (Signed)
HPI: Catherine Cantrell is a 50 y.o. female  who presents to [place] today, 11/10/15,  for chief complaint of:  Chief Complaint  Patient presents with  . Nicotine Dependence    patient would like to quit smoking    Tobacco cessation: Patient has quit smoking in the past but started up again. Previously on Chantix and did well with this, would like refill. Wellbutrin was previously ineffective. Nicotine replacement didn't really seem to work for her either.  Past medical, surgical, social and family history reviewed: Past Medical History:  Diagnosis Date  . Hyperlipidemia    Past Surgical History:  Procedure Laterality Date  . CESAREAN SECTION    . KNEE SURGERY     Social History  Substance Use Topics  . Smoking status: Never Smoker  . Smokeless tobacco: Never Used  . Alcohol use 0.5 oz/week    1 Standard drinks or equivalent per week   Family History  Problem Relation Age of Onset  . Diabetes Father   . Breast cancer Maternal Aunt   . Breast cancer Paternal Grandmother   . Diabetes Maternal Grandmother      Current medication list and allergy/intolerance information reviewed:   Current Outpatient Prescriptions  Medication Sig Dispense Refill  . valACYclovir (VALTREX) 1000 MG tablet Take 1 tablet (1,000 mg total) by mouth daily. 31 tablet 12  . zolpidem (AMBIEN) 10 MG tablet Take 0.5 tablets (5 mg total) by mouth at bedtime as needed for sleep. 30 tablet 3   No current facility-administered medications for this visit.    Allergies  Allergen Reactions  . Amoxicillin Hives  . Sulfa Antibiotics Hives      Review of Systems:  Constitutional:  No  fever, no chills, No recent illness  Cardiac: No  chest pain  Respiratory:  No  shortness of breath.  Skin: No  Rash,  Psychiatric: No  concerns with depression, No  concerns with anxiety, No sleep problems, No mood problems  Exam:  BP 128/70   Pulse 76   Ht 5\' 2"  (1.575  m)   Wt 146 lb (66.2 kg)   LMP 10/17/2015 (Approximate)   BMI 26.70 kg/m   Constitutional: VS see above. General Appearance: alert, well-developed, well-nourished, NAD  Eyes: Normal lids and conjunctive, non-icteric sclera  Ears, Nose, Mouth, Throat: MMM  Neck: No masses, trachea midline. No thyroid enlargement. No tenderness/mass appreciated. No lymphadenopathy  Respiratory: Normal respiratory effort. no wheeze, no rhonchi, no rales  Cardiovascular: S1/S2 normal, no murmur, no rub/gallop auscultated. RRR.   Psychiatric: Normal judgment/insight. Normal mood and affect. Oriented x3.   Labs reviewed: Recent CBC and CMP were normal   ASSESSMENT/PLAN: Given perception for Chantix as well as patient information on this medication with regard to different ways of administering it/timing according to quit date. Patient has quit date set for New Year's.   Tobacco dependence - Plan: varenicline (CHANTIX STARTING MONTH PAK) 0.5 MG X 11 & 1 MG X 42 tablet, varenicline (CHANTIX CONTINUING MONTH PAK) 1 MG tablet    Patient Instructions  Smoking Cessation, Tips for Success If you are ready to quit smoking, congratulations! You have chosen to help yourself be healthier. Cigarettes bring nicotine, tar, carbon monoxide, and other irritants into your body. Your lungs, heart, and blood vessels will be able to work better without these poisons. There are many different ways to quit smoking. Nicotine gum, nicotine patches, a nicotine inhaler, or nicotine nasal spray can help with physical craving.  Hypnosis, support groups, and medicines help break the habit of smoking. WHAT THINGS CAN I DO TO MAKE QUITTING EASIER?  Here are some tips to help you quit for good:  Pick a date when you will quit smoking completely. Tell all of your friends and family about your plan to quit on that date.  Do not try to slowly cut down on the number of cigarettes you are smoking. Pick a quit date and quit smoking  completely starting on that day.  Throw away all cigarettes.   Clean and remove all ashtrays from your home, work, and car.  On a card, write down your reasons for quitting. Carry the card with you and read it when you get the urge to smoke.  Cleanse your body of nicotine. Drink enough water and fluids to keep your urine clear or pale yellow. Do this after quitting to flush the nicotine from your body.  Learn to predict your moods. Do not let a bad situation be your excuse to have a cigarette. Some situations in your life might tempt you into wanting a cigarette.  Never have "just one" cigarette. It leads to wanting another and another. Remind yourself of your decision to quit.  Change habits associated with smoking. If you smoked while driving or when feeling stressed, try other activities to replace smoking. Stand up when drinking your coffee. Brush your teeth after eating. Sit in a different chair when you read the paper. Avoid alcohol while trying to quit, and try to drink fewer caffeinated beverages. Alcohol and caffeine may urge you to smoke.  Avoid foods and drinks that can trigger a desire to smoke, such as sugary or spicy foods and alcohol.  Ask people who smoke not to smoke around you.  Have something planned to do right after eating or having a cup of coffee. For example, plan to take a walk or exercise.  Try a relaxation exercise to calm you down and decrease your stress. Remember, you may be tense and nervous for the first 2 weeks after you quit, but this will pass.  Find new activities to keep your hands busy. Play with a pen, coin, or rubber band. Doodle or draw things on paper.  Brush your teeth right after eating. This will help cut down on the craving for the taste of tobacco after meals. You can also try mouthwash.   Use oral substitutes in place of cigarettes. Try using lemon drops, carrots, cinnamon sticks, or chewing gum. Keep them handy so they are available when  you have the urge to smoke.  When you have the urge to smoke, try deep breathing.  Designate your home as a nonsmoking area.  If you are a heavy smoker, ask your health care provider about a prescription for nicotine chewing gum. It can ease your withdrawal from nicotine.  Reward yourself. Set aside the cigarette money you save and buy yourself something nice.  Look for support from others. Join a support group or smoking cessation program. Ask someone at home or at work to help you with your plan to quit smoking.  Always ask yourself, "Do I need this cigarette or is this just a reflex?" Tell yourself, "Today, I choose not to smoke," or "I do not want to smoke." You are reminding yourself of your decision to quit.  Do not replace cigarette smoking with electronic cigarettes (commonly called e-cigarettes). The safety of e-cigarettes is unknown, and some may contain harmful chemicals.  If you relapse, do  not give up! Plan ahead and think about what you will do the next time you get the urge to smoke. HOW WILL I FEEL WHEN I QUIT SMOKING? You may have symptoms of withdrawal because your body is used to nicotine (the addictive substance in cigarettes). You may crave cigarettes, be irritable, feel very hungry, cough often, get headaches, or have difficulty concentrating. The withdrawal symptoms are only temporary. They are strongest when you first quit but will go away within 10-14 days. When withdrawal symptoms occur, stay in control. Think about your reasons for quitting. Remind yourself that these are signs that your body is healing and getting used to being without cigarettes. Remember that withdrawal symptoms are easier to treat than the major diseases that smoking can cause.  Even after the withdrawal is over, expect periodic urges to smoke. However, these cravings are generally short lived and will go away whether you smoke or not. Do not smoke! WHAT RESOURCES ARE AVAILABLE TO HELP ME QUIT  SMOKING? Your health care provider can direct you to community resources or hospitals for support, which may include:  Group support.  Education.  Hypnosis.  Therapy.   This information is not intended to replace advice given to you by your health care provider. Make sure you discuss any questions you have with your health care provider.   Document Released: 10/06/2003 Document Revised: 01/28/2014 Document Reviewed: 06/25/2012 Elsevier Interactive Patient Education 2016 [employer].     Visit summary with medication list and pertinent instructions was printed for patient to review. All questions at time of visit were answered - patient instructed to contact office with any additional concerns. ER/RTC precautions were reviewed with the patient. Follow-up plan: Return for annual physical when due .

## 2015-11-10 NOTE — Patient Instructions (Signed)
Smoking Cessation, Tips for Success If you are ready to quit smoking, congratulations! You have chosen to help yourself be healthier. Cigarettes bring nicotine, tar, carbon monoxide, and other irritants into your body. Your lungs, heart, and blood vessels will be able to work better without these poisons. There are many different ways to quit smoking. Nicotine gum, nicotine patches, a nicotine inhaler, or nicotine nasal spray can help with physical craving. Hypnosis, support groups, and medicines help break the habit of smoking. WHAT THINGS CAN I DO TO MAKE QUITTING EASIER?  Here are some tips to help you quit for good:  Pick a date when you will quit smoking completely. Tell all of your friends and family about your plan to quit on that date.  Do not try to slowly cut down on the number of cigarettes you are smoking. Pick a quit date and quit smoking completely starting on that day.  Throw away all cigarettes.   Clean and remove all ashtrays from your home, work, and car.  On a card, write down your reasons for quitting. Carry the card with you and read it when you get the urge to smoke.  Cleanse your body of nicotine. Drink enough water and fluids to keep your urine clear or pale yellow. Do this after quitting to flush the nicotine from your body.  Learn to predict your moods. Do not let a bad situation be your excuse to have a cigarette. Some situations in your life might tempt you into wanting a cigarette.  Never have "just one" cigarette. It leads to wanting another and another. Remind yourself of your decision to quit.  Change habits associated with smoking. If you smoked while driving or when feeling stressed, try other activities to replace smoking. Stand up when drinking your coffee. Brush your teeth after eating. Sit in a different chair when you read the paper. Avoid alcohol while trying to quit, and try to drink fewer caffeinated beverages. Alcohol and caffeine may urge you to  smoke.  Avoid foods and drinks that can trigger a desire to smoke, such as sugary or spicy foods and alcohol.  Ask people who smoke not to smoke around you.  Have something planned to do right after eating or having a cup of coffee. For example, plan to take a walk or exercise.  Try a relaxation exercise to calm you down and decrease your stress. Remember, you may be tense and nervous for the first 2 weeks after you quit, but this will pass.  Find new activities to keep your hands busy. Play with a pen, coin, or rubber band. Doodle or draw things on paper.  Brush your teeth right after eating. This will help cut down on the craving for the taste of tobacco after meals. You can also try mouthwash.   Use oral substitutes in place of cigarettes. Try using lemon drops, carrots, cinnamon sticks, or chewing gum. Keep them handy so they are available when you have the urge to smoke.  When you have the urge to smoke, try deep breathing.  Designate your home as a nonsmoking area.  If you are a heavy smoker, ask your health care provider about a prescription for nicotine chewing gum. It can ease your withdrawal from nicotine.  Reward yourself. Set aside the cigarette money you save and buy yourself something nice.  Look for support from others. Join a support group or smoking cessation program. Ask someone at home or at work to help you with your plan   to quit smoking.  Always ask yourself, "Do I need this cigarette or is this just a reflex?" Tell yourself, "Today, I choose not to smoke," or "I do not want to smoke." You are reminding yourself of your decision to quit.  Do not replace cigarette smoking with electronic cigarettes (commonly called e-cigarettes). The safety of e-cigarettes is unknown, and some may contain harmful chemicals.  If you relapse, do not give up! Plan ahead and think about what you will do the next time you get the urge to smoke. HOW WILL I FEEL WHEN I QUIT SMOKING? You  may have symptoms of withdrawal because your body is used to nicotine (the addictive substance in cigarettes). You may crave cigarettes, be irritable, feel very hungry, cough often, get headaches, or have difficulty concentrating. The withdrawal symptoms are only temporary. They are strongest when you first quit but will go away within 10-14 days. When withdrawal symptoms occur, stay in control. Think about your reasons for quitting. Remind yourself that these are signs that your body is healing and getting used to being without cigarettes. Remember that withdrawal symptoms are easier to treat than the major diseases that smoking can cause.  Even after the withdrawal is over, expect periodic urges to smoke. However, these cravings are generally short lived and will go away whether you smoke or not. Do not smoke! WHAT RESOURCES ARE AVAILABLE TO HELP ME QUIT SMOKING? Your health care provider can direct you to community resources or hospitals for support, which may include:  Group support.  Education.  Hypnosis.  Therapy.   This information is not intended to replace advice given to you by your health care provider. Make sure you discuss any questions you have with your health care provider.   Document Released: 10/06/2003 Document Revised: 01/28/2014 Document Reviewed: 06/25/2012 Elsevier Interactive Patient Education 2016 [employer]  

## 2015-11-12 DIAGNOSIS — F17200 Nicotine dependence, unspecified, uncomplicated: Secondary | ICD-10-CM | POA: Insufficient documentation

## 2015-12-12 ENCOUNTER — Telehealth: Payer: Self-pay | Admitting: *Deleted

## 2015-12-12 NOTE — Telephone Encounter (Signed)
PA was submitted and denied for chantix continuing month pak.Called patient and she  has tried and failed otc nicotine replacement gum. She states it made her feel sick on her stomach. Appeal sent

## 2015-12-12 NOTE — Telephone Encounter (Signed)
[phone] 

## 2015-12-20 NOTE — Telephone Encounter (Signed)
Appeal has been approved through 12/19/2016. Left message on patient's vm and pharmacy vm.

## 2015-12-29 ENCOUNTER — Telehealth: Payer: Self-pay | Admitting: *Deleted

## 2015-12-29 DIAGNOSIS — B009 Herpesviral infection, unspecified: Secondary | ICD-10-CM

## 2015-12-29 MED ORDER — VALACYCLOVIR HCL 500 MG PO TABS: 500.0000 mg | ORAL_TABLET | Freq: Two times a day (BID) | ORAL | 12 refills | Status: DC

## 2015-12-29 NOTE — Telephone Encounter (Signed)
Pt called stating that she cannot swallow pills very good and is wanting to change her Valtrex to 500 mg tablets thinking they will be smaller.  I told her just to cut the 1000mg  in half but she prefers a new RX for 500.  She is aware that she will now have to take 2 pills versus the 1.  This was sent to [place].

## 2016-03-04 ENCOUNTER — Encounter: Payer: 59 | Admitting: Osteopathic Medicine

## 2016-05-09 ENCOUNTER — Ambulatory Visit (INDEPENDENT_AMBULATORY_CARE_PROVIDER_SITE_OTHER): Payer: 59 | Admitting: Osteopathic Medicine

## 2016-05-09 ENCOUNTER — Encounter: Payer: Self-pay | Admitting: Osteopathic Medicine

## 2016-05-09 VITALS — BP 116/54 | HR 75 | Ht 62.0 in | Wt 144.0 lb

## 2016-05-09 DIAGNOSIS — K219 Gastro-esophageal reflux disease without esophagitis: Secondary | ICD-10-CM | POA: Diagnosis not present

## 2016-05-09 DIAGNOSIS — Z1211 Encounter for screening for malignant neoplasm of colon: Secondary | ICD-10-CM

## 2016-05-09 DIAGNOSIS — K649 Unspecified hemorrhoids: Secondary | ICD-10-CM

## 2016-05-09 DIAGNOSIS — K5900 Constipation, unspecified: Secondary | ICD-10-CM | POA: Insufficient documentation

## 2016-05-09 DIAGNOSIS — Z0000 Encounter for general adult medical examination without abnormal findings: Secondary | ICD-10-CM | POA: Diagnosis not present

## 2016-05-09 MED ORDER — HYDROCORTISONE ACETATE 25 MG RE SUPP
25.0000 mg | Freq: Two times a day (BID) | RECTAL | 2 refills | Status: DC | PRN
Start: 2016-05-09 — End: 2016-06-19

## 2016-05-09 MED ORDER — OMEPRAZOLE 20 MG PO TBEC
20.0000 mg | DELAYED_RELEASE_TABLET | Freq: Every day | ORAL | 1 refills | Status: DC
Start: 2016-05-09 — End: 2016-06-19

## 2016-05-09 NOTE — Progress Notes (Signed)
HPI: Catherine Cantrell is a 51 y.o. female  who presents to [place] today, 05/09/16,  for chief complaint of:  Chief Complaint  Patient presents with  . Annual Exam    See below for review of preventive care   Constipation: stable, taking MiraLax mostly daily. Hx IBS.   Hemorrhoids: occasionally bleeding but minimal pain.   GERD: "I think I might have an ulcer." New problem. x3 days. Started Prilosec OTC and feels much better. No hx ulcer or GI bleed in the past     Past medical, surgical, social and family history reviewed: Patient Active Problem List   Diagnosis Date Noted  . Tobacco dependence 11/12/2015   Past Surgical History:  Procedure Laterality Date  . CESAREAN SECTION    . KNEE SURGERY     Social History  Substance Use Topics  . Smoking status: Never Smoker  . Smokeless tobacco: Never Used  . Alcohol use 0.5 oz/week    1 Standard drinks or equivalent per week   Family History  Problem Relation Age of Onset  . Diabetes Father   . Breast cancer Maternal Aunt   . Breast cancer Paternal Grandmother   . Diabetes Maternal Grandmother      Current medication list and allergy/intolerance information reviewed:   Current Outpatient Prescriptions  Medication Sig Dispense Refill  . valACYclovir (VALTREX) 500 MG tablet Take 1 tablet (500 mg total) by mouth 2 (two) times daily. 60 tablet 12  . varenicline (CHANTIX CONTINUING MONTH PAK) 1 MG tablet Take 1 tablet (1 mg total) by mouth 2 (two) times daily. 60 tablet 1  . varenicline (CHANTIX STARTING MONTH PAK) 0.5 MG X 11 & 1 MG X 42 tablet Take one 0.5 mg tablet by mouth once daily for 3 days, then increase to one 0.5 mg tablet twice daily for 4 days, then increase to one 1 mg tablet twice daily. 42 tablet 0  . zolpidem (AMBIEN) 10 MG tablet Take 0.5 tablets (5 mg total) by mouth at bedtime as needed for sleep. 30 tablet 3   No current facility-administered medications for  this visit.    Allergies  Allergen Reactions  . Amoxicillin Hives  . Sulfa Antibiotics Hives      Review of Systems:  Constitutional:  No  fever, no chills, No recent illness, No unintentional weight changes. No significant fatigue.   HEENT: No  headache, no vision change  Cardiac: No  chest pain, No  pressure, No palpitations  Respiratory:  No  shortness of breath. No  Cough  Gastrointestinal: No  abdominal pain, No  nausea, No  vomiting,  No  blood in stool, No  diarrhea, +chronic constipation   Musculoskeletal: No new myalgia/arthralgia  Skin: No  Rash,  Hem/Onc: No  easy bruising/bleeding  Endocrine: No cold intolerance,  No heat intolerance. No polyuria/polydipsia/polyphagia   Neurologic: No  weakness, No  dizziness  Psychiatric: No  concerns with depression, No  concerns with anxiety, No sleep problems, No mood problems  Exam:  BP (!) 116/54   Pulse 75   Ht 5\' 2"  (1.575 m)   Wt 144 lb (65.3 kg)   BMI 26.34 kg/m   Constitutional: VS see above. General Appearance: alert, well-developed, well-nourished, NAD  Eyes: Normal lids and conjunctive, non-icteric sclera  Ears, Nose, Mouth, Throat: MMM, Normal external inspection ears/nares/mouth/lips/gums. TM normal bilaterally. Pharynx/tonsils no erythema, no exudate. Nasal mucosa normal.    Neck: No masses, trachea midline. No thyroid enlargement.  No tenderness/mass appreciated. No lymphadenopathy  Respiratory: Normal respiratory effort. no wheeze, no rhonchi, no rales  Cardiovascular: S1/S2 normal, no murmur, no rub/gallop auscultated. RRR. No lower extremity edema.   Gastrointestinal: Nontender, no masses. No hepatomegaly, no splenomegaly. No hernia appreciated. Bowel sounds normal. Rectal exam deferred.   Musculoskeletal: Gait normal. No clubbing/cyanosis of digits.   Neurological: Normal balance/coordination. No tremor. No cranial nerve deficit on limited exam. Motor and sensation intact and symmetric.  Cerebellar reflexes intact.   Skin: warm, dry, intact. No rash/ulcer. No concerning nevi or subq nodules on limited exam.    Psychiatric: Normal judgment/insight. Normal mood and affect. Oriented x3.      ASSESSMENT/PLAN: Preventive care as noted below. Patient opts for fecal immunochemical testing. States had last tetanus less than 10 years ago. Well woman care through OB/GYN. See below for patient instructions regarding brief review of other medical problems.  Annual physical exam - Plan: VITAMIN D 25 Hydroxy (Vit-D Deficiency, Fractures), Vitamin B12, CANCELED: VITAMIN D 25 Hydroxy (Vit-D Deficiency, Fractures), CANCELED: Vitamin B12, CANCELED: Vitamin B12  Constipation, unspecified constipation type  Gastroesophageal reflux disease, esophagitis presence not specified - Plan: Omeprazole 20 MG TBEC  Hemorrhoids, unspecified hemorrhoid type - Plan: hydrocortisone (ANUSOL-HC) 25 MG suppository  Colon cancer screening - Plan: Cologuard    FEMALE PREVENTIVE CARE  ANNUAL SCREENING/COUNSELING Tobacco - Current  vaping about 6 mos, hx smoking x 30 year 1ppd  Alcohol - social drinker Diet/Exercise - HEALTHY HABITS DISCUSSED TO DECREASE CV RISK, working on weight loss, eating more proteins and reducing calories Sexual Health - Yes with female. STI - The patient denies history of sexually transmitted disease. INTERESTED IN STI TESTING - no Depression - PQH2 Negative Domestic violence concerns - no HTN SCREENING - SEE VITALS Vaccination status - SEE BELOW  INFECTIOUS DISEASE SCREENING HIV - all adults 15-65 - does not need GC/CT - sexually active - does not need HepC - born 84-1965 - does not need TB - if risk/required by employer - does not need  DISEASE SCREENING Lipid - needs DM2 - needs Osteoporosis - age 94+ or one sooner if risk - does not need NOTE: Got labs through [facility],   CANCER SCREENING Cervical - GYN (DR DOVE) Breast - does not need GYN (DR DOVE) Lung -  does not need now but consider at age 60 due to 75 pack year and at 66 will have quit <15 years prior Colon - needs  ADULT VACCINATION Influenza - annual - was offered and declined by the patient Td booster every 10 years - was not indicated - given <10 years ago after an accident per patient  HPV - age <36yo - was not indicated Zoster - age 51+ - was not indicated, never had chickenpox but CDC recommends vaccination at 20-60 regardless of zoster history Pneumonia - age 71+ sooner if risk (DM, smoker, other) - not needed  OTHER Fall - exercise and Vit D age 43+ - does not need Consider ASA - age 25-59 - does not need    Patient Instructions  Preventive care:  Basic labs: Lipids/Cholesterol, CMP, CBC, A1C   Checking Vitamin D and B12 today  Cologuard for colon cancer screening   Paps and mammograms with Dr. Hulan Fray as directed  Other medical issues discussed today:  Omeprazole (for acid reflux) can take for a few weeks then discontinue - if sympotms persist/worsen please let me know!   See below for constipation recommendations: always get plenty of exercise even if  it's only walking, increased fiber and hydration are the basic first steps!   Hemorrhoids: suppositories sent to use as needed    Visit summary with medication list and pertinent instructions was printed for patient to review. All questions at time of visit were answered - patient instructed to contact office with any additional concerns. ER/RTC precautions were reviewed with the patient. Follow-up plan: Return in about 1 year (around 05/09/2017) for [place], sooner if needed .

## 2016-05-09 NOTE — Patient Instructions (Addendum)
Preventive care:  Basic labs: Lipids/Cholesterol, CMP, CBC, A1C   Checking Vitamin D and B12 today  Cologuard for colon cancer screening   Paps and mammograms with Dr. Hulan Fray as directed  Other medical issues discussed today:  Omeprazole (for acid reflux) can take for a few weeks then discontinue - if sympotms persist/worsen please let me know!   See below for constipation recommendations: always get plenty of exercise even if it's only walking, increased fiber and hydration are the basic first steps!   Hemorrhoids: suppositories sent to use as needed                                       Patient education: Constipation in adults (Beyond the Basics)  Author: Bettey Mare, MD Section Editor: Carman Ching, MD Deputy Editor: Penelope Coop, MD, MPH, AGAF Contributor Disclosures All topics are updated as new evidence becomes available and our peer review process is complete.  Literature review current through: Feb 2017.  This topic last updated: Aug 12, 2011.   CONSTIPATION OVERVIEW - Constipation refers to a change in bowel habits, but it has varied meanings. Stools may be too hard or too small, difficult to pass, or infrequent (less than three times per week). People with constipation may also notice a frequent need to strain and a sense that the bowels are not empty.  Constipation is a very common problem. Each year more than 2.5 million Americans visit their healthcare provider for relief from this problem. Many factors can contribute to or cause constipation, although in most people, no single cause can be found. In general, constipation occurs more frequently as you get older. (See "Etiology and evaluation of chronic constipation in adults".)  CONSTIPATION DIAGNOSIS - Constipation can usually be diagnosed based upon your symptoms and a physical examination. You should also mention any medications you take regularly since some medications can cause  constipation.  You may need a rectal examination as part of a physical examination. A rectal examination involves inserting a gloved finger inside the rectum to feel for any lumps or abnormalities. This test can also check for blood in the stool.  Further testing may be ordered in some situations, for example, if you have had a recent change in bowel habits, blood in the stool, weight loss, or a family history of colon cancer. Testing may include blood tests, x-rays, sigmoidoscopy, colonoscopy, or more specialized testing if needed. (See "Patient education: Flexible sigmoidoscopy (Beyond the Basics)" and "Patient education: Colonoscopy (Beyond the Basics)".)  When to seek help - Most people can treat constipation at home, without seeing a healthcare provider. However, you should speak with a healthcare provider if the problem: ?Is new (ie, represents a change in your normal pattern) ?Lasts longer than three weeks ?Is severe ?Is associated with any other concerning features such as blood on the toilet paper, weight loss, fevers, or weakness   CONSTIPATION TREATMENT - Treatment for constipation includes changing some behaviors, eating foods high in fiber, and using laxatives or enemas if needed.  You can try these treatments at home, before seeing a healthcare provider. However, if you do not have a bowel movement within a few days, you should call your healthcare provider for further assistance.   Behavior changes - The bowels are most active following meals, and this is often the time when stools will pass most readily. If you ignore your body's signals  to have a bowel movement, the signals become weaker and weaker over time. By paying close attention to these signals, you may have an easier time moving your bowels. Drinking a caffeine-containing beverage in the morning may also be helpful.  Increase fiber - Increasing fiber in your diet may reduce or eliminate constipation. The recommended  amount of dietary fiber is 20 to 35 grams of fiber per day. By reading the product information panel on the side of the package, you can determine the number of grams of fiber per serving Many fruits and vegetables can be particularly helpful in preventing and treating constipation. This is especially true of citrus fruits, prunes, and prune juice. Some breakfast cereals are also an excellent source of dietary fiber.  Fiber side effects - Consuming large amounts of fiber can cause abdominal bloating or gas; this can be minimized by starting with a small amount and slowly increasing until stools become softer and more frequent.   LAXATIVES - If behavior changes and increasing fiber does not relieve your constipation, you may try taking a laxative. A variety of laxatives are available for treating constipation. The choice between them is based upon how they work, how safe the treatment is, and your healthcare provider's preferences. In general, laxatives can be categorized into the following groups:  Bulk forming laxatives - These include natural fiber and commercial fiber preparations such as: ?Psyllium (Konsyl; Metamucil; Perdiem) ?Methylcellulose (Citrucel) ?Calcium polycarbophil (FiberCon; Fiber-Lax; Mitrolan) ?Wheat dextrin (Benefiber) You should increase the dose of fiber supplements slowly to prevent gas and cramping, and you should always take the supplement with plenty of fluid.  Hyperosmolar laxatives - Hyperosmolar laxatives include: ?Polyethylene glycol (MiraLax, Glycolax) ?Lactulose ?Sorbitol Polyethylene glycol is generally preferred since it does not cause gas or bloating and is available in the [place] without a prescription. Lactulose and sorbitol can produce gas and bloating. Sorbitol works as well as lactulose and is much less expensive.  Saline laxatives - Saline laxatives such as magnesium hydroxide (Milk of Magnesia) and magnesium citrate (Evac-Q-Mag) act similarly to  the hyperosmolar laxatives.  Stimulant laxatives - Stimulant laxatives include senna (eg, Black Draught, Ex-lax, Fletcher's, [place], Senokot) and bisacodyl (eg, Correctol, Doxidan, Dulcolax). Some people overuse stimulant laxatives. Taking stimulant laxatives regularly or in large amounts can cause side effects, including low potassium levels. Thus, you should take these drugs carefully if you must use them regularly. However, there is no convincing evidence that using stimulant laxatives regularly damages the colon, and they do not increase the risk for colorectal cancer or other tumors.  New treatments - Lubiprostone (Amitiza) is a prescription medication that treats severe constipation. It is expensive, but it may be recommended if you do not respond to other treatments. Linaclotide (Linzess) is another prescription medication that has been approved for the treatment of chronic constipation. It is also an expensive medication as compared with other agents with the exception of lubiprostone. Linaclotide may be recommended if you do not respond to other treatments.  Pills, suppositories, or enemas? - Laxatives are available as pills that you take by mouth or as suppositories or enemas that you insert into the rectum. In general, suppositories and enemas work more quickly compared to pills, but many people do not like using them.  Healthcare providers occasionally recommend prepackaged enema kits containing sodium phosphate/biphosphate (Fleet) if you have not responded to other treatments. These are not recommended if you have problems with your heart or kidneys, and should not be used more than once  unless directed by your healthcare provider.  Constipation treatments to avoid ?Emollients - Emollient laxatives, principally mineral oil, soften stools by moisturizing them. However, other treatments have fewer risks and equal benefit. ?Natural products - A wide variety of natural products are advertised  for constipation. Some of them contain the active ingredients found in commercially available laxatives. However, their dose and purity may not be carefully controlled. Thus, these products are not generally recommended.  A variety of home-made enema preparations have been used throughout the years, such as soapsuds, hydrogen peroxide, and household detergents. These can be extremely irritating to the lining of the intestine and should be avoided.   BIOFEEDBACK FOR CONSTIPATION - Biofeedback is a behavioral approach that may help some people with severe chronic constipation who involuntarily squeeze (rather than relax) their muscles while having a bowel movement.   WHERE TO GET MORE INFORMATION - Your healthcare provider is the best source of information for questions and concerns related to your medical problem. This article will be updated as needed on our website [email or web address]). Related topics for patients, as well as selected articles written for healthcare professionals, are also available. Some of the most relevant are listed below.

## 2016-06-19 ENCOUNTER — Encounter: Payer: Self-pay | Admitting: Obstetrics & Gynecology

## 2016-06-19 ENCOUNTER — Ambulatory Visit (INDEPENDENT_AMBULATORY_CARE_PROVIDER_SITE_OTHER): Payer: 59 | Admitting: Obstetrics & Gynecology

## 2016-06-19 ENCOUNTER — Encounter: Payer: Self-pay | Admitting: *Deleted

## 2016-06-19 ENCOUNTER — Other Ambulatory Visit: Payer: Self-pay | Admitting: Obstetrics & Gynecology

## 2016-06-19 VITALS — BP 118/70 | HR 71 | Resp 16 | Ht 62.0 in | Wt 145.0 lb

## 2016-06-19 DIAGNOSIS — N898 Other specified noninflammatory disorders of vagina: Secondary | ICD-10-CM | POA: Diagnosis not present

## 2016-06-19 DIAGNOSIS — R140 Abdominal distension (gaseous): Secondary | ICD-10-CM

## 2016-06-19 DIAGNOSIS — N949 Unspecified condition associated with female genital organs and menstrual cycle: Secondary | ICD-10-CM

## 2016-06-19 DIAGNOSIS — Z3202 Encounter for pregnancy test, result negative: Secondary | ICD-10-CM

## 2016-06-19 DIAGNOSIS — N926 Irregular menstruation, unspecified: Secondary | ICD-10-CM | POA: Diagnosis not present

## 2016-06-19 NOTE — Progress Notes (Signed)
   Subjective:    Patient ID: Catherine Cantrell, female    DOB: 05-Jul-1965, 51 y.o.   MRN: [id number]  HPI  Pt c/o menses coming 2x a month.  It has gotten heavier than norml.  She has bloating and vaginal odor associated with it.  Pt has constipation, no /N/V/D.  No urinary complaints.  Pt has not had intercourse since last visit (STD check negative).    Review of Systems  Constitutional: Negative.   Respiratory: Negative.   Cardiovascular: Negative.   Gastrointestinal: Positive for abdominal distention and constipation. Negative for abdominal pain, blood in stool, diarrhea, nausea and vomiting.  Genitourinary: Positive for menstrual problem and vaginal bleeding. Negative for dysuria.  Psychiatric/Behavioral: Negative.        Objective:   Physical Exam  Constitutional: She is oriented to person, place, and time. She appears well-developed and well-nourished. No distress.  HENT:  Head: Normocephalic and atraumatic.  Eyes: Conjunctivae are normal.  Cardiovascular: Normal rate.   Pulmonary/Chest: Effort normal.  Abdominal: Soft. She exhibits no distension and no mass. There is no tenderness. There is no rebound and no guarding.  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   Vagina:  Pink, non lesion Cervix:  No lesion, non CMT Uterus:  Retroverted Adnexa  No masses NT  Musculoskeletal: She exhibits no edema.  Neurological: She is alert and oriented to person, place, and time.  Skin: Skin is warm and dry.  Psychiatric: She has a normal mood and affect.  Vitals reviewed.  Vitals:   06/19/16 1021  BP: 118/70  Pulse: 71  Resp: 16  Weight: 145 lb (65.8 kg)  Height: 5\' 2"  (1.575 m)    Assessment & Plan:  51 yo female with menomet that has worsened over last year.  1-upt neg 2-BD affirm for odor 3-Labs per pt request 4-endometrial biopsy 5-TA/TV  US 6-RTC 2 weeks for follow up.  ENDOMETRIAL BIOPSY     The indications for endometrial biopsy were reviewed.   Risks of the biopsy including cramping, bleeding, infection, uterine perforation, inadequate specimen and need for additional procedures  were discussed. The patient states she understands and agrees to undergo procedure today. Consent was signed. Time out was performed. Urine HCG was negative. A sterile speculum was placed in the patient's vagina and the cervix was prepped with Betadine. A single-toothed tenaculum was placed on the anterior lip of the cervix to stabilize it. The 3 mm pipelle was introduced into the endometrial cavity without difficulty to a depth of 8 cm, and a moderate amount of tissue was obtained and sent to pathology. The instruments were removed from the patient's vagina. Minimal bleeding from the cervix was noted. The patient tolerated the procedure well. Routine post-procedure instructions were given to the patient. The patient will follow up to review the results and for further management.

## 2016-06-19 NOTE — Addendum Note (Signed)
Addended by: Sherryle Lis L on: 06/19/2016 12:00 PM   Modules accepted: Orders

## 2016-06-20 LAB — VITAMIN D 25 HYDROXY (VIT D DEFICIENCY, FRACTURES): VIT D 25 HYDROXY: 39 ng/mL (ref 30.0–100.0)

## 2016-06-20 LAB — VITAMIN B12: VITAMIN B 12: 287 pg/mL (ref 232–1245)

## 2016-06-21 ENCOUNTER — Other Ambulatory Visit: Payer: 59

## 2016-06-22 LAB — CBC WITH DIFFERENTIAL/PLATELET
BASOS ABS: 0.1 10*3/uL (ref 0.0–0.2)
Basos: 1 %
EOS (ABSOLUTE): 0.1 10*3/uL (ref 0.0–0.4)
Eos: 2 %
HEMOGLOBIN: 15.4 g/dL (ref 11.1–15.9)
Hematocrit: 46.4 % (ref 34.0–46.6)
IMMATURE GRANULOCYTES: 3 %
Immature Grans (Abs): 0.2 10*3/uL — ABNORMAL HIGH (ref 0.0–0.1)
LYMPHS ABS: 2.4 10*3/uL (ref 0.7–3.1)
Lymphs: 41 %
MCH: 31.2 pg (ref 26.6–33.0)
MCHC: 33.2 g/dL (ref 31.5–35.7)
MCV: 94 fL (ref 79–97)
MONOS ABS: 0.6 10*3/uL (ref 0.1–0.9)
Monocytes: 10 %
NEUTROS PCT: 43 %
Neutrophils Absolute: 2.6 10*3/uL (ref 1.4–7.0)
PLATELETS: 298 10*3/uL (ref 150–379)
RBC: 4.93 x10E6/uL (ref 3.77–5.28)
RDW: 13.5 % (ref 12.3–15.4)
WBC: 5.8 10*3/uL (ref 3.4–10.8)

## 2016-06-22 LAB — NUSWAB BV AND CANDIDA, NAA
Atopobium vaginae: HIGH Score — AB
BVAB 2: HIGH {score} — AB
CANDIDA GLABRATA, NAA: NEGATIVE
Candida albicans, NAA: NEGATIVE
MEGASPHAERA 1: HIGH {score} — AB

## 2016-06-24 ENCOUNTER — Telehealth: Payer: Self-pay | Admitting: *Deleted

## 2016-06-24 DIAGNOSIS — N760 Acute vaginitis: Principal | ICD-10-CM

## 2016-06-24 DIAGNOSIS — B9689 Other specified bacterial agents as the cause of diseases classified elsewhere: Secondary | ICD-10-CM

## 2016-06-24 MED ORDER — METRONIDAZOLE 500 MG PO TABS: 500.0000 mg | ORAL_TABLET | Freq: Two times a day (BID) | ORAL | 0 refills | Status: DC

## 2016-06-24 NOTE — Telephone Encounter (Signed)
-----   Message from Guss Bunde, MD sent at 06/24/2016  2:42 PM EDT ----- Benign endometrial biopsy.  Treat with Flagyl for 7 days per protocol (BV)

## 2016-06-24 NOTE — Telephone Encounter (Signed)
Pt notified fo neg Endo BX however she does have BV and RX for Flagyl was sent to [facility].  Pt made aware to not drink ETOH while taking the med.  She is OOT until Thursday but will pick up RX then.

## 2016-06-28 ENCOUNTER — Ambulatory Visit (HOSPITAL_COMMUNITY)
Admission: RE | Admit: 2016-06-28 | Discharge: 2016-06-28 | Disposition: A | Discharge status: Home / Self Care | Payer: 59 | Source: Ambulatory Visit | Attending: Obstetrics & Gynecology | Admitting: Obstetrics & Gynecology

## 2016-06-28 DIAGNOSIS — D259 Leiomyoma of uterus, unspecified: Secondary | ICD-10-CM | POA: Diagnosis not present

## 2016-06-28 DIAGNOSIS — N926 Irregular menstruation, unspecified: Secondary | ICD-10-CM | POA: Diagnosis not present

## 2016-06-29 ENCOUNTER — Other Ambulatory Visit: Payer: Self-pay | Admitting: Obstetrics & Gynecology

## 2016-07-01 ENCOUNTER — Telehealth: Payer: Self-pay | Admitting: *Deleted

## 2016-07-01 NOTE — Telephone Encounter (Signed)
-----   Message from Guss Bunde, MD sent at 06/29/2016  5:19 PM EDT ----- [place] normal except for a few small fibroids.  Nml ovaries.  Pt is to come in to discuss management.

## 2016-07-01 NOTE — Telephone Encounter (Signed)
Pt notified of U/S report and she is opting to wait on any interventions @ this time.

## 2016-07-16 LAB — COLOGUARD: COLOGUARD: NEGATIVE

## 2016-07-23 ENCOUNTER — Telehealth: Payer: Self-pay | Admitting: Osteopathic Medicine

## 2016-07-23 NOTE — Telephone Encounter (Signed)
Please call patient: I have received her results from cologuard, testing was NEGATIVE, plan to repeat in 3 years

## 2016-07-23 NOTE — Telephone Encounter (Signed)
Left message asking for a return call.

## 2016-07-25 ENCOUNTER — Other Ambulatory Visit: Payer: Self-pay | Admitting: Osteopathic Medicine

## 2016-07-25 DIAGNOSIS — K219 Gastro-esophageal reflux disease without esophagitis: Secondary | ICD-10-CM

## 2016-07-25 NOTE — Telephone Encounter (Signed)
Pt notified of results -EH/RMA   

## 2016-07-31 ENCOUNTER — Encounter: Payer: Self-pay | Admitting: Osteopathic Medicine

## 2016-11-06 ENCOUNTER — Telehealth: Payer: Self-pay | Admitting: Osteopathic Medicine

## 2016-11-06 NOTE — Telephone Encounter (Signed)
Appeal form filled out and placed in assistance basket. Please call patient: Please find out if there is some where we can fax this to, we do not have the capability to scan and email back to patients at this point. We can also leave it up front for her to pick up. Please ask which she would like to do and proceed accordingly.

## 2016-11-06 NOTE — Telephone Encounter (Signed)
Patient advised that paperwork is ready for her to pickup. Catherine Cantrell,CMA

## 2016-11-15 ENCOUNTER — Ambulatory Visit: Payer: 59 | Admitting: Osteopathic Medicine

## 2016-11-15 DIAGNOSIS — Z0189 Encounter for other specified special examinations: Secondary | ICD-10-CM

## 2017-09-09 ENCOUNTER — Ambulatory Visit (INDEPENDENT_AMBULATORY_CARE_PROVIDER_SITE_OTHER): Payer: Managed Care, Other (non HMO) | Admitting: Obstetrics & Gynecology

## 2017-09-09 ENCOUNTER — Encounter: Payer: Self-pay | Admitting: Obstetrics & Gynecology

## 2017-09-09 VITALS — BP 136/73 | HR 79 | Resp 16 | Ht 62.0 in | Wt 132.0 lb

## 2017-09-09 DIAGNOSIS — Z01419 Encounter for gynecological examination (general) (routine) without abnormal findings: Secondary | ICD-10-CM | POA: Diagnosis not present

## 2017-09-09 NOTE — Progress Notes (Signed)
Subjective:    Catherine Cantrell is a 52 y.o. married P2 (sons- 32 and 21)  female who presents for an annual exam. The patient has no complaints today. The DUB resolved, now has periods q4-6 weeks. The patient is not currently sexually active due to her husband's bladder cancer. GYN screening history: last pap: was normal. The patient wears seatbelts: yes. The patient participates in regular exercise: yes. Has the patient ever been transfused or tattooed?: yes. The patient reports that there is not domestic violence in her life.   Menstrual History: OB History    Gravida  3   Para  2   Term      Preterm      AB  1   Living  2     SAB      TAB  1   Ectopic      Multiple      Live Births              Menarche age: 62 Patient's last menstrual period was 08/09/2017.    The following portions of the patient's history were reviewed and updated as appropriate: allergies, current medications, past family history, past medical history, past social history, past surgical history and problem list.  Review of Systems Pertinent items are noted in HPI.   FH- ?breast in [place], no colon or gyn Married for 24 years Works at [employer] done at work (awesome results) Declines a flu vaccine    Objective:    BP 136/73   Pulse 79   Resp 16   Ht 5\' 2"  (1.575 m)   Wt 132 lb (59.9 kg)   LMP 08/09/2017   BMI 24.14 kg/m   General Appearance:    Alert, cooperative, no distress, appears stated age  Head:    Normocephalic, without obvious abnormality, atraumatic  Eyes:    PERRL, conjunctiva/corneas clear, EOM's intact, fundi    benign, both eyes  Ears:    Normal TM's and external ear canals, both ears  Nose:   Nares normal, septum midline, mucosa normal, no drainage    or sinus tenderness  Throat:   Lips, mucosa, and tongue normal; teeth and gums normal  Neck:   Supple, symmetrical, trachea midline, no adenopathy;    thyroid:  no  enlargement/tenderness/nodules; no carotid   bruit or JVD  Back:     Symmetric, no curvature, ROM normal, no CVA tenderness  Lungs:     Clear to auscultation bilaterally, respirations unlabored  Chest Wall:    No tenderness or deformity   Heart:    Regular rate and rhythm, S1 and S2 normal, no murmur, rub   or gallop  Breast Exam:    No tenderness, masses, or nipple abnormality  Abdomen:     Soft, non-tender, bowel sounds active all four quadrants,    no masses, no organomegaly  Genitalia:    Normal female without lesion, discharge or tenderness, normal size and shape, retroverted, mobile, non-tender, normal adnexal exam      Extremities:   Extremities normal, atraumatic, no cyanosis or edema  Pulses:   2+ and symmetric all extremities  Skin:   Skin color, texture, turgor normal, no rashes or lesions  Lymph nodes:   Cervical, supraclavicular, and axillary nodes normal  Neurologic:   CNII-XII intact, normal strength, sensation and reflexes    throughout  .    Assessment:    Healthy female exam.    Plan:  Thin prep Pap smear. with cotesting She declines a flu vaccine

## 2017-09-09 NOTE — Addendum Note (Signed)
Addended by: Asencion Islam on: 09/09/2017 04:48 PM   Modules accepted: Orders

## 2017-09-09 NOTE — Addendum Note (Signed)
Addended by: Lyndal Rainbow on: 09/09/2017 03:22 PM   Modules accepted: Orders

## 2017-09-15 LAB — PAP IG AND HPV HIGH-RISK
HPV, high-risk: POSITIVE — AB
PAP Smear Comment: 0

## 2017-09-16 ENCOUNTER — Ambulatory Visit: Payer: 59 | Admitting: Obstetrics & Gynecology

## 2017-09-17 ENCOUNTER — Other Ambulatory Visit: Payer: Managed Care, Other (non HMO)

## 2017-09-25 ENCOUNTER — Other Ambulatory Visit: Payer: Self-pay | Admitting: *Deleted

## 2017-09-25 DIAGNOSIS — Z01419 Encounter for gynecological examination (general) (routine) without abnormal findings: Secondary | ICD-10-CM

## 2018-04-08 ENCOUNTER — Encounter: Payer: Managed Care, Other (non HMO) | Admitting: Osteopathic Medicine

## 2018-08-20 LAB — CBC
Hematocrit: 42 % (ref 34.0–46.6)
Hemoglobin: 14.1 g/dL (ref 11.1–15.9)
MCH: 31.8 pg (ref 26.6–33.0)
MCHC: 33.6 g/dL (ref 31.5–35.7)
MCV: 95 fL (ref 79–97)
Platelets: 285 10*3/uL (ref 150–450)
RBC: 4.44 x10E6/uL (ref 3.77–5.28)
RDW: 12.1 % (ref 11.7–15.4)
WBC: 4.2 10*3/uL (ref 3.4–10.8)

## 2018-08-20 LAB — VITAMIN D 25 HYDROXY (VIT D DEFICIENCY, FRACTURES): Vit D, 25-Hydroxy: 39.1 ng/mL (ref 30.0–100.0)

## 2018-08-20 LAB — NMR, LIPOPROFILE
Cholesterol, Total: 261 mg/dL — ABNORMAL HIGH (ref 100–199)
HDL Particle Number: 49.9 umol/L (ref >=30.5)
HDL-C: 104 mg/dL (ref >39)
LDL Particle Number: 1212 nmol/L — ABNORMAL HIGH (ref <1000)
LDL Size: 22 nm (ref >20.5)
LDL-C: 138 mg/dL — ABNORMAL HIGH (ref 0–99)
LP-IR Score: <25 (ref <=45)
Small LDL Particle Number: <90 nmol/L (ref <=527)
Triglycerides: 94 mg/dL (ref 0–149)

## 2018-08-20 LAB — VITAMIN B12: Vitamin B-12: 238 pg/mL (ref 232–1245)

## 2018-08-20 LAB — POTASSIUM: Potassium: 4.6 mmol/L (ref 3.5–5.2)

## 2018-08-20 LAB — LITHIUM LEVEL: Lithium Lvl: <0.1 mmol/L — ABNORMAL LOW (ref 0.6–1.2)

## 2018-08-20 LAB — MAGNESIUM: Magnesium: 2 mg/dL (ref 1.6–2.3)

## 2018-08-20 LAB — TSH: TSH: 1.73 u[IU]/mL (ref 0.450–4.500)

## 2018-08-20 LAB — C-PEPTIDE: C-Peptide: 2.2 ng/mL (ref 1.1–4.4)

## 2018-08-25 ENCOUNTER — Other Ambulatory Visit: Payer: Self-pay

## 2018-08-25 ENCOUNTER — Ambulatory Visit: Payer: Managed Care, Other (non HMO) | Admitting: Internal Medicine

## 2018-08-25 ENCOUNTER — Encounter: Payer: Self-pay | Admitting: Internal Medicine

## 2018-08-25 VITALS — BP 116/80 | HR 60 | Temp 97.9°F | Ht 62.6 in | Wt 128.0 lb

## 2018-08-25 DIAGNOSIS — N951 Menopausal and female climacteric states: Secondary | ICD-10-CM

## 2018-08-25 DIAGNOSIS — Z720 Tobacco use: Secondary | ICD-10-CM | POA: Diagnosis not present

## 2018-08-25 DIAGNOSIS — D519 Vitamin B12 deficiency anemia, unspecified: Secondary | ICD-10-CM | POA: Diagnosis not present

## 2018-08-25 DIAGNOSIS — F5101 Primary insomnia: Secondary | ICD-10-CM

## 2018-08-25 MED ORDER — CYANOCOBALAMIN 1000 MCG/ML IJ SOLN
1000.0000 ug | Freq: Once | INTRAMUSCULAR | Status: AC
  Administered 2018-08-25: 15:00:00 1000 ug via INTRAMUSCULAR

## 2018-08-25 NOTE — Progress Notes (Signed)
Subjective:     Patient ID: Catherine Cantrell , female    DOB: December 24, 1965 , 53 y.o.   MRN: [id number]   Chief Complaint  Patient presents with  . Hormones f/u    HPI   She is here today to establish care with [facility].  She was referred by a friend of hers b/c of the improvement in her menopausal symptoms.  She denies having any chronic medical issues. She is followed by her PCP and GYN.  She is hoping the BHRT therapy will help with her menopausal symptoms.  She reports that she has issues with insomnia. She has difficulty falling asleep and staying asleep. This has worsened over the past several years.  She adds that her hot flashes have increased over the past six months as well. She is unable to state what triggers her symptoms.   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 She denies that these menopausal symptoms interfere with her work.   She reports being up to date with mammogram.  She does add that her Paternal grandmother had breast CA in 57s.  Insomnia Primary symptoms: sleep disturbance, difficulty falling asleep.  The current episode started more than one month. The onset quality is gradual. The problem occurs nightly. The problem is unchanged. Prior diagnostic workup includes:  No prior workup.     Past Medical History:  Diagnosis Date  . Hyperlipidemia      Family History  Problem Relation Age of Onset  . Pulmonary Hypertension Mother   . Raynaud syndrome Mother   . Charcot-Marie-Tooth disease Mother   . Diabetes Father   . Breast cancer Maternal Aunt   . Breast cancer Paternal Grandmother   . Diabetes Maternal Grandmother      Current Outpatient Medications:  .  omega-3 acid ethyl esters (LOVAZA) 1 g capsule, Take by mouth 2 (two) times daily., Disp: , Rfl:  .  Polyethylene Glycol 3350 (MIRALAX PO), Take by mouth., Disp: , Rfl:    Allergies  Allergen Reactions  . Amoxicillin Hives  . Sulfa Antibiotics Hives      Review of Systems  Constitutional: Negative.   Respiratory: Negative.   Cardiovascular: Negative.   Gastrointestinal: Negative.   Genitourinary: Positive for menstrual problem.       She has had issues with menorrhagia.  GYN performed EMB which was negative. Ultrasound performed which revealed uterine fibroids.   Neurological: Negative.   Psychiatric/Behavioral: Positive for sleep disturbance. The patient has insomnia.      Today's Vitals   08/25/18 1046  BP: 116/80  Pulse: 60  Temp: 97.9 F (36.6 C)  Weight: 128 lb (58.1 kg)  Height: 5' 2.6" (1.59 m)   Body mass index is 22.96 kg/m.   Objective:  Physical Exam Vitals signs and nursing note reviewed.  Constitutional:      Appearance: Normal appearance.  HENT:     Head: Normocephalic and atraumatic.  Cardiovascular:     Rate and Rhythm: Normal rate and regular rhythm.     Heart sounds: Normal heart sounds.  Pulmonary:     Effort: Pulmonary effort is normal.     Breath sounds: Normal breath sounds.  Skin:    General: Skin is warm.  Neurological:     General: No focal deficit present.     Mental Status: She is alert.  Psychiatric:        Mood and Affect: Mood normal.        Behavior:  Behavior normal.         Assessment And Plan:     1. Female climacteric state  We discussed the use of BHRT to decrease menopausal symptoms. Importance of hormonal balance to decrease risk of breast/uterine cancer was discussed with the patient. Based on her symptoms, I suspect her progesterone is low. She agrees to move forward with saliva testing. She was instructed on how to submit saliva samples and the timing of each sample. She will rto in four weeks to go over her results in full detail.   - Liver Profile  2. Primary insomnia  Chronic. Importance of bedtime hygiene was discussed with the patient. She is also encouraged to develop a bedtime routine. She was given rx and 10-day voucher for Dayvigo 5mg  to use nightly as  needed.  If not effective, she will get refill of zolpidem from her PCP.   3. Anemia due to vitamin B12 deficiency, unspecified B12 deficiency type  I will check labs as listed below. She was also given vitamin B12 IM x1.   - Methylmalonic Acid - Vitamin B12 - cyanocobalamin ((VITAMIN B-12)) injection 1,000 mcg  4. Tobacco use  I plan to revisit smoking cessation efforts at her next visit.  I will also inquire to see if she has had low dose CT chest scan.    Maximino Greenland, MD    THE PATIENT IS ENCOURAGED TO PRACTICE SOCIAL DISTANCING DUE TO THE COVID-19 PANDEMIC.

## 2018-08-25 NOTE — Patient Instructions (Signed)
Vitamin B12 Deficiency Vitamin B12 deficiency means that your body does not have enough vitamin B12. The body needs this vitamin:  To make red blood cells.  To make genes (DNA).  To help the nerves work. If you do not have enough vitamin B12 in your body, you can have health problems. What are the causes?  Not eating enough foods that contain vitamin B12.  Not being able to absorb vitamin B12 from the food that you eat.  Certain digestive system diseases.  A condition in which the body does not make enough of a certain protein, which results in too few red blood cells (pernicious anemia).  Having a surgery in which part of the stomach or small intestine is removed.  Taking medicines that make it hard for the body to absorb vitamin B12. These medicines include: ? Heartburn medicines. ? Some antibiotic medicines. ? Other medicines that are used to treat certain conditions. What increases the risk?  Being older than age 72.  Eating a vegetarian or vegan diet, especially while you are pregnant.  Eating a poor diet while you are pregnant.  Taking certain medicines.  Having alcoholism. What are the signs or symptoms? In some cases, there are no symptoms. If the condition leads to too few blood cells or nerve damage, symptoms can occur, such as:  Feeling weak.  Feeling tired (fatigued).  Not being hungry.  Weight loss.  A loss of feeling (numbness) or tingling in your hands and feet.  Redness and burning of the tongue.  Being mixed up (confused) or having memory problems.  Sadness (depression).  Problems with your senses. This can include color blindness, ringing in the ears, or loss of taste.  Watery poop (diarrhea) or trouble pooping (constipation).  Trouble walking. If anemia is very bad, symptoms can include:  Being short of breath.  Being dizzy.  Having a very fast heartbeat. How is this treated?  Changing the way you eat and drink, such as: ?  Eating more foods that contain vitamin B12. ? Drinking little or no alcohol.  Getting vitamin B12 shots.  Taking vitamin B12 supplements. Your doctor will tell you the dose that is best for you. Follow these instructions at home: Eating and drinking   Eat lots of healthy foods that contain vitamin B12. These include: ? Meats and poultry, such as beef, pork, chicken, [place], and organ meats, such as liver. ? Seafood, such as clams, rainbow trout, salmon, tuna, and haddock. ? Eggs. ? Cereal and dairy products that have vitamin B12 added to them. Check the label. The items listed above may not be a complete list of what you can eat and drink. Contact a dietitian for more options. General instructions  Get any shots as told by your doctor.  Take supplements only as told by your doctor.  Do not drink alcohol if your doctor tells you not to. In some cases, you may only be asked to limit alcohol use.  Keep all follow-up visits as told by your doctor. This is important. Contact a doctor if:  Your symptoms come back. Get help right away if:  You have trouble breathing.  You have a very fast heartbeat.  You have chest pain.  You get dizzy.  You pass out. Summary  Vitamin B12 deficiency means that your body is not getting enough vitamin B12.  In some cases, there are no symptoms of this condition.  Treatment may include making a change in the way you eat and drink,  getting vitamin B12 shots, or taking supplements.  Eat lots of healthy foods that contain vitamin B12. This information is not intended to replace advice given to you by your health care provider. Make sure you discuss any questions you have with your health care provider. Document Released: 12/27/2010 Document Revised: 09/16/2017 Document Reviewed: 09/16/2017 Elsevier Patient Education  2020 [employer].

## 2018-08-26 MED ORDER — DAYVIGO 5 MG PO TABS: 5.0000 mg | ORAL_TABLET | Freq: Every evening | ORAL | 0 refills | Status: DC | PRN

## 2018-08-27 LAB — HEPATIC FUNCTION PANEL
ALT: 16 IU/L (ref 0–32)
AST: 17 IU/L (ref 0–40)
Albumin: 4.7 g/dL (ref 3.8–4.9)
Alkaline Phosphatase: 57 IU/L (ref 39–117)
Bilirubin Total: 0.7 mg/dL (ref 0.0–1.2)
Bilirubin, Direct: 0.18 mg/dL (ref 0.00–0.40)
Total Protein: 7 g/dL (ref 6.0–8.5)

## 2018-08-27 LAB — METHYLMALONIC ACID, SERUM: Methylmalonic Acid: 164 nmol/L (ref 0–378)

## 2018-09-01 ENCOUNTER — Other Ambulatory Visit: Payer: Self-pay

## 2018-09-01 ENCOUNTER — Ambulatory Visit (INDEPENDENT_AMBULATORY_CARE_PROVIDER_SITE_OTHER): Payer: Managed Care, Other (non HMO)

## 2018-09-01 ENCOUNTER — Telehealth: Payer: Self-pay | Admitting: *Deleted

## 2018-09-01 DIAGNOSIS — N898 Other specified noninflammatory disorders of vagina: Secondary | ICD-10-CM

## 2018-09-01 NOTE — Progress Notes (Signed)
Pt c/o vaginal discharge and odor. Denies UTI symptoms. Self swab sent to [facility] per pt.  Pt is aware we will treat based off of results.

## 2018-09-01 NOTE — Telephone Encounter (Signed)
Returned call from 8:38am and left patient a message to call and schedule appointment.

## 2018-09-04 LAB — NUSWAB VG+, CANDIDA 6SP
Atopobium vaginae: HIGH Score — AB
C PARAPSILOSIS/TROPICALIS: NEGATIVE
Candida albicans, NAA: NEGATIVE
Candida glabrata, NAA: NEGATIVE
Candida krusei, NAA: NEGATIVE
Candida lusitaniae, NAA: NEGATIVE
Chlamydia trachomatis, NAA: NEGATIVE
Neisseria gonorrhoeae, NAA: NEGATIVE
Trich vag by NAA: NEGATIVE

## 2018-09-07 ENCOUNTER — Telehealth: Payer: Self-pay | Admitting: Internal Medicine

## 2018-09-07 NOTE — Telephone Encounter (Signed)
LMOVM that I called her back last Tuesday regarding her a new kit and called again today to call me back or to come by the office to pick up a new BHRT kit

## 2018-09-20 ENCOUNTER — Encounter: Payer: Self-pay | Admitting: Internal Medicine

## 2018-09-21 ENCOUNTER — Ambulatory Visit (INDEPENDENT_AMBULATORY_CARE_PROVIDER_SITE_OTHER): Payer: Managed Care, Other (non HMO) | Admitting: Obstetrics & Gynecology

## 2018-09-21 ENCOUNTER — Other Ambulatory Visit: Payer: Self-pay

## 2018-09-21 ENCOUNTER — Encounter: Payer: Self-pay | Admitting: Obstetrics & Gynecology

## 2018-09-21 VITALS — BP 106/67 | HR 77 | Ht 62.0 in | Wt 128.0 lb

## 2018-09-21 DIAGNOSIS — Z01419 Encounter for gynecological examination (general) (routine) without abnormal findings: Secondary | ICD-10-CM

## 2018-09-21 DIAGNOSIS — N951 Menopausal and female climacteric states: Secondary | ICD-10-CM | POA: Insufficient documentation

## 2018-09-21 DIAGNOSIS — B9689 Other specified bacterial agents as the cause of diseases classified elsewhere: Secondary | ICD-10-CM

## 2018-09-21 DIAGNOSIS — N898 Other specified noninflammatory disorders of vagina: Secondary | ICD-10-CM

## 2018-09-21 DIAGNOSIS — N760 Acute vaginitis: Secondary | ICD-10-CM

## 2018-09-21 DIAGNOSIS — G4701 Insomnia due to medical condition: Secondary | ICD-10-CM

## 2018-09-21 DIAGNOSIS — R87810 Cervical high risk human papillomavirus (HPV) DNA test positive: Secondary | ICD-10-CM

## 2018-09-21 DIAGNOSIS — G4700 Insomnia, unspecified: Secondary | ICD-10-CM | POA: Insufficient documentation

## 2018-09-21 MED ORDER — TRAZODONE HCL 50 MG PO TABS: ORAL_TABLET | ORAL | 1 refills | Status: DC

## 2018-09-21 NOTE — Progress Notes (Addendum)
Subjective:     Catherine Cantrell is a 53 y.o. female here for a routine exam.  Current complaints: menopausal symptoms wanting bioidentical HRT.  Recurrent BV symptoms wanting preventative therapy.  She feels like she is starting another infection now.  Pt also having problems sleeping and had "hangover" from Ambien.  Would like to try something else.    Gynecologic History Patient's last menstrual period was 10/10/2017. Contraception: post menopausal status Last Pap: 2019. 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 Results were: normal  Obstetric History OB History  Gravida Para Term Preterm AB Living  3 2     1 2   SAB TAB Ectopic Multiple Live Births    1          # Outcome Date GA Lbr Len/2nd Weight Sex Delivery Anes PTL Lv  3 Para           2 Para           1 TAB              The following portions of the patient's history were reviewed and updated as appropriate: allergies, current medications, past family history, past medical history, past social history, past surgical history and problem list.  Review of Systems Pertinent items noted in HPI and remainder of comprehensive ROS otherwise negative.    Objective:      Vitals:   09/21/18 0931  BP: 106/67  Pulse: 77  Weight: 128 lb (58.1 kg)  Height: 5\' 2"  (1.575 m)   Vitals:  WNL General appearance: alert, cooperative and no distress  HEENT: Normocephalic, without obvious abnormality, atraumatic Eyes: negative Throat: lips, mucosa, and tongue normal; teeth and gums normal  Respiratory: Clear to auscultation bilaterally  CV: Regular rate and rhythm  Breasts:  Normal appearance, no masses or tenderness, no nipple retraction or dimpling  GI: Soft, non-tender; bowel sounds normal; no masses,  no organomegaly  GU: External Genitalia:  Tanner V, pt has known hypopigmentation on left labia majora and hyperpigmentation on labia minora.  Has been biopsied numerous times.  Pt does self vulvar exams and there has been  no change. Urethra:  No prolapse   Vagina: Pale pink, normal rugae, no blood or discharge  Cervix: No CMT, no lesion  Uterus:  Normal size and contour, non tender  Adnexa: Normal, no masses, non tender  Musculoskeletal: No edema, redness or tenderness in the calves or thighs  Skin: No lesions or rash  Lymphatic: Axillary adenopathy: none     Psychiatric: Normal mood and behavior        Assessment:    Healthy female exam.   Recurrent BV Menopausal symptoms   Plan:    Pap with cotesting Mammogram Referral to dr. [name] for bio identical hormones Test for vaginitis (Aptima); pt will try Boric Acid capsules 2x/wk for preventative. Flu shot declined Pt seeing PCP for rest of health care maintenance Trazadone for sleep.  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  Colposcopy is recommended per ASCCP guidelines.  Pt would like to wait on colposcopy and would like another PAP with HPV testing in 6 months.  Pt understands this is not the recommended therapy and there is risk of undiagnosed pre cancer/cancer.  Will repeat pap in 6 months with HPV testing.

## 2018-09-21 NOTE — Progress Notes (Signed)
Last pap- 09/09/17- HPV High Risk Pt states last mammogram was 2019- normal- Pt does not know if she wants to do mammograms

## 2018-09-23 LAB — NUSWAB VG+, CANDIDA 6SP
Atopobium vaginae: HIGH Score — AB
C PARAPSILOSIS/TROPICALIS: NEGATIVE
Candida albicans, NAA: NEGATIVE
Candida glabrata, NAA: NEGATIVE
Candida krusei, NAA: NEGATIVE
Candida lusitaniae, NAA: NEGATIVE
Chlamydia trachomatis, NAA: NEGATIVE
Neisseria gonorrhoeae, NAA: NEGATIVE
Trich vag by NAA: NEGATIVE

## 2018-09-27 LAB — PAP IG AND HPV HIGH-RISK
HPV, high-risk: POSITIVE — AB
PAP Smear Comment: 0

## 2018-09-29 ENCOUNTER — Telehealth: Payer: Self-pay

## 2018-09-29 DIAGNOSIS — R87810 Cervical high risk human papillomavirus (HPV) DNA test positive: Secondary | ICD-10-CM | POA: Insufficient documentation

## 2018-09-29 NOTE — Telephone Encounter (Addendum)
Attempted to call pt to let her know that Dr.Leggett will do pap in 6 months instead of colpo at this time. Pt did not answer. VM left for pt to return call to office.   ----- Message from Guss Bunde, MD sent at 09/29/2018 10:22 AM EDT ----- Regarding: RE: Pap Smear/ Colpo Yes, she can opt out of the colposcopy,  please document the phone call (it is not what we recommend and we will work with her so that she continues care).  I will also document on my end.  Please put her on a list to call when the March schedule comes out.    Thanks D!  When is that wedding date? ----- Message ----- From: Lyndal Rainbow, CMA Sent: 09/29/2018   9:31 AM EDT To: Guss Bunde, MD Subject: Pap Smear/ Colpo                               Hey Dr,Leggett,  You sent a message wanting this pt scheduled for a colpo due to HPV on her pap 2 years in a row. I called pt and discussed this with her and she wants me to ask you if she can opt out of the colpo right now and just do another pap smear in 6 months and then go from there. Please advise.  Thanks!

## 2018-09-29 NOTE — Telephone Encounter (Addendum)
Attempted to call pt with pap smear results and to schedule colpo. Pt did not answer. MyChart message sent with results and recommendation for a colpo. Office number provided for pt to call and schedule.   ----- Message from Guss Bunde, MD sent at 09/27/2018  4:36 PM EDT ----- HPV+ for 2 years.  Pt needs colposcopy.  Please call patient with appt.

## 2018-09-30 ENCOUNTER — Ambulatory Visit: Payer: Managed Care, Other (non HMO) | Admitting: Internal Medicine

## 2018-10-06 ENCOUNTER — Encounter: Payer: Self-pay | Admitting: Family Medicine

## 2018-10-06 ENCOUNTER — Ambulatory Visit (INDEPENDENT_AMBULATORY_CARE_PROVIDER_SITE_OTHER): Payer: Managed Care, Other (non HMO) | Admitting: Family Medicine

## 2018-10-06 ENCOUNTER — Other Ambulatory Visit: Payer: Self-pay

## 2018-10-06 VITALS — BP 116/62 | HR 67 | Temp 98.9°F | Ht 62.0 in | Wt 133.0 lb

## 2018-10-06 DIAGNOSIS — S0083XA Contusion of other part of head, initial encounter: Secondary | ICD-10-CM

## 2018-10-06 DIAGNOSIS — S060X0A Concussion without loss of consciousness, initial encounter: Secondary | ICD-10-CM

## 2018-10-06 MED ORDER — ARTIFICIAL TEARS OPHTHALMIC OINT: TOPICAL_OINTMENT | OPHTHALMIC | 2 refills | Status: DC | PRN

## 2018-10-06 NOTE — Progress Notes (Signed)
Catherine Cantrell is a 53 y.o. female who presents to [facility] today for fall facial injury.  Patient was in her normal state of health on Saturday, September 12.  She tripped and fell landing on her right side of her face.  This is at night and she did not see this coming at all and was not able to get her hands in front of her.  She developed bruising and pain.  Since then she is feeling a lot better but she notes the area at the maxilla just inferior to the orbit is quite tender.  Additionally she notes that she is having a slower blink on the right side than the left side.  She denies any trouble moving her eye or blurry vision or double vision with eye motion.  She denies any loss of consciousness.  She denies severe headache.  She notes that her balance is a little bit worse now than it was before the accident however she is overall feeling a lot better.    ROS as above:  Exam:  BP 116/62   Pulse 67   Temp 98.9 F (37.2 C) (Oral)   Ht 5\' 2"  (1.575 m)   Wt 133 lb (60.3 kg)   LMP 10/10/2017   BMI 24.33 kg/m  Wt Readings from Last 5 Encounters:  10/06/18 133 lb (60.3 kg)  09/21/18 128 lb (58.1 kg)  08/25/18 128 lb (58.1 kg)  09/09/17 132 lb (59.9 kg)  06/19/16 145 lb (65.8 kg)    Gen: Well NAD HEENT: EOMI,  MMM swelling and bruising right maxilla.  Tender palpation zygomatic area right maxilla inferior to orbit.  Normal eye motion but no diplopia.  Blink is slow on right compared to left. Lungs: Normal work of breathing. CTABL Heart: RRR no MRG Abd: NABS, Soft. Nondistended, Nontender Exts: Brisk capillary refill, warm and well perfused.  Neuro alert and oriented normal coordination balance and gait.  Normal finger-nose-finger.  Normal speech and thought process.     Assessment and Plan: 53 y.o. female with fall with injury to right maxillary  face.  Exam today is somewhat concerning for fracture of maxilla or orbital fracture.  Doubtful for severe entrapment given relatively normal eye motion.  The obvious next step here would be CT maxillofacial however patient is very hesitant to proceed with this test due to cost and radiation.  Plan for Lacri-Lube and Systane artificial tears to maintain IV hydration and follow-up with ophthalmology.  If worsening or not improving neck step would be CT scan which patient is agreeing to in the future if needed.  Cautioned against that unrecognized orbital fracture can lead to poor outcomes severe infection disability or even death.  She expresses understanding and agreement.  In addition I think it is possible the patient has a concussion.  She seems to be doing pretty well however and will likely improve as do most people within 1 to 3 weeks.  Watchful waiting relative rest.  PDMP not reviewed this encounter. No orders of the defined types were placed in this encounter.  Meds ordered this encounter  Medications  . artificial tears (LACRILUBE) OINT ophthalmic ointment    Sig: Place into both eyes every 4 (four) hours as needed for dry eyes.    Dispense:  1 Tube    Refill:  2     Historical information moved to improve visibility of documentation.  Past Medical History:  Diagnosis Date  . Hyperlipidemia    Past Surgical History:  Procedure Laterality Date  . [place]  . KNEE SURGERY Right 1998   ACL repair   Social History   Tobacco Use  . Smoking status: Current Every Day Smoker    Years: 30.00    Types: Cigarettes  . Smokeless tobacco: Never Used  . Tobacco comment: she does not wish to quit at this time  Substance Use Topics  . Alcohol use: Yes    Alcohol/week: 1.0 standard drinks    Types: 1 Standard drinks or equivalent per week   family history includes Breast cancer in her maternal aunt and paternal grandmother; Charcot-Marie-Tooth disease in her  mother; Diabetes in her father and maternal grandmother; Pulmonary Hypertension in her mother; Raynaud syndrome in her mother.  Medications: Current Outpatient Medications  Medication Sig Dispense Refill  . omega-3 acid ethyl esters (LOVAZA) 1 g capsule Take by mouth 2 (two) times daily.    . Polyethylene Glycol 3350 (MIRALAX PO) Take by mouth.    . traZODone (DESYREL) 50 MG tablet Take 1/2 to 1 tablet at night for sleep. 30 tablet 1  . vitamin B-12 (CYANOCOBALAMIN) 100 MCG tablet Take 100 mcg by mouth daily.    Marland Kitchen artificial tears (LACRILUBE) OINT ophthalmic ointment Place into both eyes every 4 (four) hours as needed for dry eyes. 1 Tube 2   No current facility-administered medications for this visit.    Allergies  Allergen Reactions  . Amoxicillin Hives  . Sulfa Antibiotics Hives     Discussed warning signs or symptoms. Please see discharge instructions. Patient expresses understanding.

## 2018-10-06 NOTE — Patient Instructions (Signed)
Thank you for coming in today. I am concerned about a fracture of the bones of your face. I do think CT scan is a good idea however we will keep an eye on it.  It is a good idea to see your eye doctor as well.  If worsening or not improving let me know and I will order the correct CT scan.  Keep me updated.  For concussion this will resolve in the next 1-3 weeks.  Take it easy if your activity makes your symptoms worse (example screen time)    Orbital Floor Fracture With Entrapment The orbital floor is the bottom wall of the eye socket (orbit). An orbital floor fracture with entrapment is a break in the orbital floor that allows nearby muscle, tissue, or both to get trapped inside of the fracture. This usually causes swelling and pain, and often affects (impairs) vision. Treatment depends on how severe your entrapment is, and how old you are. In most cases, you will take medicine until your swelling goes down. Then your health care provider will evaluate whether you need more treatment, such as surgery. Children can get a type of fracture that requires surgical treatment sooner (trapdoor fracture). Orbital fractures with entrapment are often more serious in children than in adults. What are the causes? This condition is caused by an injury to the eye, such as a hard, direct hit. What are the signs or symptoms? Symptoms of this condition include:  Swelling and bruising around the eye, causing a "black eye" appearance.  A crunching sound when pressure is placed on or around the eye area, such as when the eyelid is touched.  Seeing two of everything ("double vision"), with one object appearing higher than the other (vertical diplopia). This may get worse when you look up.  Inability to move the eye or look in a certain direction (palsy).  Pain around the eye when you look up.  One eye looking sunken compared to the other eye (enophthalmos).  Numbness of the cheek, inner nose, and upper  gum on the same side of the face as the injury. How is this diagnosed? This condition may be diagnosed based on:  Your symptoms and medical history.  An eye exam.  An X-ray or CT scan. How is this treated? This condition may first be treated with medicines, such as one or more of the following:  Antibiotics to treat or prevent infection.  Steroids to reduce swelling.  Decongestants to help relieve sinus stuffiness (congestion).  Medicines to reduce pain. The fracture itself is usually not treated until all the swelling around the eye has gone away. This may take 1-2 weeks. After your swelling goes away:  If you have persistent vertical double vision, an eye specialist (ophthalmologist) may try to free the entrapped tissue. If this is not possible, you may need surgery.  If you have double vision only when looking up, your health care provider will discuss treatment options with you. Some people who do not spend a lot of time looking up choose not to have more treatment. Others who need to look up often, such as electricians, need treatment. You may need surgery sooner than later if you have:  A trapdoor fracture.  Symptoms of a nervous system response (oculocardiac reflex) such as: ? Nausea or vomiting. ? A slow heart rate. ? Dizziness. ? Loss of consciousness. Follow these instructions at home: Safety  Do not drive or perform your regular activities without your health care provider's approval.  Be aware that if you are only using one eye to see, you may have difficulty judging distances (depth perception). Eye care  Always follow recommendations about wearing protective glasses or goggles.  Do not wear contact lenses until your health care provider says it is okay. Managing pain and swelling   If directed, put ice over your eye area to help reduce swelling: ? Put ice in a plastic bag. ? Place a towel between your skin and the bag. ? Leave the ice on for 20 minutes,  2-3 times a day.  If recommended by your health care provider, sleep with one or two extra pillows under your head. Keeping your head raised slightly when lying down can help with swelling and pain. Medicines  Take over-the-counter and prescription medicines only as told by your health care provider.  If you were prescribed an antibiotic medicine, take it as told by your health care provider. Do not stop taking the antibiotic even if you start to feel better. General instructions  Do not touch, rub, or try to move your eye.  Do not blow your nose until your health care provider says it is okay.  Stay away from dusty areas.  Avoid traveling by plane or going to high-altitude areas until you recover. These activities may slow the healing of your swelling and may increase sinus pain.  Keep all follow-up visits as told by your health care provider. This is important. Contact a health care provider if you:  Have vision changes, such as double vision that gets worse.  Notice that the redness or swelling around your eye gets worse instead of better.  Notice blood or fluid starting to leak from your nose.  Have a fever. Get help right away if you:  Have a sensation that you are seeing flashing lights.  Have sudden blindness.  Have nausea or vomiting.  Notice that your heart is beating much slower than normal.  Have chest pain.  Are light-headed.  Are short of breath. Summary  An orbital floor fracture with entrapment is a break in the orbital floor that allows nearby muscle, tissue, or both to get trapped inside of the fracture.  This condition may cause you to see two of everything, with one object appearing higher than the other (vertical diplopia).  The fracture itself is usually not treated until all the swelling around the eye has gone away. This may take 1-2 weeks.  You should not drive or perform your regular activities without your health care provider's approval.  This information is not intended to replace advice given to you by your health care provider. Make sure you discuss any questions you have with your health care provider. Document Released: 07/03/2000 Document Revised: 03/21/2017 Document Reviewed: 11/21/2016 Elsevier Patient Education  2020 [employer].

## 2018-10-19 ENCOUNTER — Ambulatory Visit: Payer: Managed Care, Other (non HMO) | Admitting: Internal Medicine

## 2018-12-01 ENCOUNTER — Ambulatory Visit: Payer: Managed Care, Other (non HMO) | Admitting: Internal Medicine

## 2018-12-01 ENCOUNTER — Encounter: Payer: Self-pay | Admitting: Internal Medicine

## 2018-12-01 ENCOUNTER — Other Ambulatory Visit: Payer: Self-pay

## 2018-12-01 VITALS — BP 108/62 | HR 63 | Temp 98.2°F | Ht 62.0 in | Wt 129.8 lb

## 2018-12-01 DIAGNOSIS — D513 Other dietary vitamin B12 deficiency anemia: Secondary | ICD-10-CM

## 2018-12-01 DIAGNOSIS — N951 Menopausal and female climacteric states: Secondary | ICD-10-CM

## 2018-12-01 DIAGNOSIS — F5101 Primary insomnia: Secondary | ICD-10-CM | POA: Diagnosis not present

## 2018-12-01 MED ORDER — CYANOCOBALAMIN 1000 MCG/ML IJ SOLN
1000.0000 ug | Freq: Once | INTRAMUSCULAR | Status: AC
  Administered 2018-12-01: 1000 ug via INTRAMUSCULAR

## 2018-12-02 LAB — VITAMIN B12: Vitamin B-12: 338 pg/mL (ref 232–1245)

## 2018-12-19 NOTE — Progress Notes (Signed)
Subjective:     Patient ID: Catherine Cantrell , female    DOB: February 07, 1965 , 53 y.o.   MRN: [id number]   Chief Complaint  Patient presents with  . Hormones f/u    HPI  She is here today to go over her saliva test results. Her most bothersome complaint is difficulty sleeping. She denies having any hot flashes. She does admit to foggy thinking. She is hoping BHRT therapy will help to address these issues.     Past Medical History:  Diagnosis Date  . Hyperlipidemia      Family History  Problem Relation Age of Onset  . Pulmonary Hypertension Mother   . Raynaud syndrome Mother   . Charcot-Marie-Tooth disease Mother   . Diabetes Father   . Breast cancer Maternal Aunt   . Breast cancer Paternal Grandmother   . Diabetes Maternal Grandmother      Current Outpatient Medications:  .  artificial tears (LACRILUBE) OINT ophthalmic ointment, Place into both eyes every 4 (four) hours as needed for dry eyes., Disp: 1 Tube, Rfl: 2 .  Polyethylene Glycol 3350 (MIRALAX PO), Take by mouth., Disp: , Rfl:  .  omega-3 acid ethyl esters (LOVAZA) 1 g capsule, Take by mouth 2 (two) times daily., Disp: , Rfl:  .  traZODone (DESYREL) 50 MG tablet, Take 1/2 to 1 tablet at night for sleep. (Patient not taking: Reported on 12/01/2018), Disp: 30 tablet, Rfl: 1   Allergies  Allergen Reactions  . Amoxicillin Hives  . Sulfa Antibiotics Hives     Review of Systems  Constitutional: Negative.   Respiratory: Negative.   Cardiovascular: Negative.   Gastrointestinal: Negative.   Neurological: Negative.   Psychiatric/Behavioral: Positive for sleep disturbance.     Today's Vitals   12/01/18 1545  BP: 108/62  Pulse: 63  Temp: 98.2 F (36.8 C)  TempSrc: Oral  Weight: 129 lb 12.8 oz (58.9 kg)  Height: 5\' 2"  (1.575 m)   Body mass index is 23.74 kg/m.   Objective:  Physical Exam Vitals signs and nursing note reviewed.  Constitutional:      Appearance: Normal appearance.  HENT:     Head:  Normocephalic and atraumatic.  Cardiovascular:     Rate and Rhythm: Normal rate and regular rhythm.     Heart sounds: Normal heart sounds.  Pulmonary:     Effort: Pulmonary effort is normal.     Breath sounds: Normal breath sounds.  Skin:    General: Skin is warm.  Neurological:     General: No focal deficit present.     Mental Status: She is alert.  Psychiatric:        Mood and Affect: Mood normal.        Behavior: Behavior normal.         Assessment And Plan:     1. Female climacteric state  I went over her saliva test results in full detail.  Results significant for nl estrone, nl estradiol, nl estriol levels which resulted in a nl estrogen quotient.  She had low progesterone level which resulted in low PG/E2 ratio.  I will send in rx progesterone SR 100mg  nightly, except Sundays. Lastly, she had nl testosterone and nl DHEA levels. She will rto in four to six weeks for re-evaluation.  Greater than 50% of face to face time was spent in counseling regarding medications and treatment plan.  I spent 25 minutes with the patient.  All questions were answered to her satisfaction.   2. Primary  insomnia  Chronic. Importance of good bedtime hygiene was discussed with the patient.   3. Other dietary vitamin B12 deficiency anemia  She reports h/o vitamin B12 deficiency. I will check vitamin B12 levels today.  She was also given vitamin B12 IM x 1.   - Vitamin B12 - cyanocobalamin ((VITAMIN B-12)) injection 1,000 mcg        Maximino Greenland, MD    THE PATIENT IS ENCOURAGED TO PRACTICE SOCIAL DISTANCING DUE TO THE COVID-19 PANDEMIC.

## 2019-01-06 ENCOUNTER — Telehealth (INDEPENDENT_AMBULATORY_CARE_PROVIDER_SITE_OTHER): Payer: Managed Care, Other (non HMO) | Admitting: Internal Medicine

## 2019-01-06 ENCOUNTER — Other Ambulatory Visit: Payer: Self-pay

## 2019-01-06 ENCOUNTER — Encounter: Payer: Self-pay | Admitting: Internal Medicine

## 2019-01-06 VITALS — Ht 62.0 in

## 2019-01-06 DIAGNOSIS — F5101 Primary insomnia: Secondary | ICD-10-CM | POA: Diagnosis not present

## 2019-01-06 DIAGNOSIS — N951 Menopausal and female climacteric states: Secondary | ICD-10-CM

## 2019-01-06 NOTE — Progress Notes (Signed)
Virtual Visit via Video   This visit type was conducted due to national recommendations for restrictions regarding the COVID-19 Pandemic (e.g. social distancing) in an effort to limit this patient's exposure and mitigate transmission in our community.  Due to her co-morbid illnesses, this patient is at least at moderate risk for complications without adequate follow up.  This format is felt to be most appropriate for this patient at this time.  All issues noted in this document were discussed and addressed.  A limited physical exam was performed with this format.    This visit type was conducted due to national recommendations for restrictions regarding the COVID-19 Pandemic (e.g. social distancing) in an effort to limit this patient's exposure and mitigate transmission in our community.  Patients identity confirmed using two different identifiers.  This format is felt to be most appropriate for this patient at this time.  All issues noted in this document were discussed and addressed.  No physical exam was performed (except for noted visual exam findings with Video Visits).    Date:  01/06/2019   ID:  Catherine Cantrell, DOB 1965/11/05, MRN [id number]  Patient Location:  Home  Provider location:   Office    Chief Complaint:  "I need BHRT f/u"  History of Present Illness:    Catherine Cantrell is a 53 y.o. female who presents via video conferencing for a telehealth visit today.    The patient does not have symptoms concerning for COVID-19 infection (fever, chills, cough, or new shortness of breath).   She presents today for virtual visit. She prefers this method of contact due to COVID-19 pandemic.  She presents today for f/u BHRT. She was started on progesterone at her last visit, and has done well. She reports she is feeling more like "herself".  She is pleased with her results thus far.     Past Medical History:  Diagnosis Date  . Hyperlipidemia    Past Surgical  History:  Procedure Laterality Date  . [place]  . KNEE SURGERY Right 1998   ACL repair     Current Meds  Medication Sig  . artificial tears (LACRILUBE) OINT ophthalmic ointment Place into both eyes every 4 (four) hours as needed for dry eyes.  Marland Kitchen omega-3 acid ethyl esters (LOVAZA) 1 g capsule Take by mouth 2 (two) times daily. prn  . Polyethylene Glycol 3350 (MIRALAX PO) Take by mouth.     Allergies:   Amoxicillin and Sulfa antibiotics   Social History   Tobacco Use  . Smoking status: Current Every Day Smoker    Years: 30.00    Types: Cigarettes  . Smokeless tobacco: Never Used  . Tobacco comment: she does not wish to quit at this time  Substance Use Topics  . Alcohol use: Yes    Alcohol/week: 1.0 standard drinks    Types: 1 Standard drinks or equivalent per week  . Drug use: No     Family Hx: The patient's family history includes Breast cancer in her maternal aunt and paternal grandmother; Charcot-Marie-Tooth disease in her mother; Diabetes in her father and maternal grandmother; Pulmonary Hypertension in her mother; Raynaud syndrome in her mother.  ROS:   Please see the history of present illness.    Review of Systems  Constitutional: Negative.   Respiratory: Negative.   Cardiovascular: Negative.   Gastrointestinal: Negative.   Neurological: Negative.   Psychiatric/Behavioral: Negative.     All other systems reviewed and are negative.  Labs/Other Tests and Data Reviewed:    Recent Labs: 08/19/2018: Hemoglobin 14.1; Magnesium 2.0; Platelets 285; Potassium 4.6; TSH 1.730 08/25/2018: ALT 16   Recent Lipid Panel No results found for: CHOL, TRIG, HDL, CHOLHDL, LDLCALC, LDLDIRECT  Wt Readings from Last 3 Encounters:  12/01/18 129 lb 12.8 oz (58.9 kg)  10/06/18 133 lb (60.3 kg)  09/21/18 128 lb (58.1 kg)     Exam:    Vital Signs:  Ht 5\' 2"  (1.575 m)   LMP 10/10/2017   BMI 23.74 kg/m     Physical Exam  Constitutional: She is oriented  to person, place, and time and well-developed, well-nourished, and in no distress.  HENT:  Head: Normocephalic and atraumatic.  Pulmonary/Chest: Effort normal.  Musculoskeletal:     Cervical back: Normal range of motion.  Neurological: She is alert and oriented to person, place, and time.  Psychiatric: Affect normal.  Nursing note and vitals reviewed.   ASSESSMENT & PLAN:     1. Female climacteric state  Her sx have improved with BHRT therapy. She will continue with progesterone capsules nightly, except Sundays. She will rto in 3 months for re-evaluation.   2. Primary insomnia  Chronic, yet improved.  She will continue with progesterone nightly.   COVID-19 Education: The signs and symptoms of COVID-19 were discussed with the patient and how to seek care for testing (follow up with PCP or arrange E-visit).  The importance of social distancing was discussed today.  Patient Risk:   After full review of this patients clinical status, I feel that they are at least moderate risk at this time.     Medication Adjustments/Labs and Tests Ordered: Current medicines are reviewed at length with the patient today.  Concerns regarding medicines are outlined above.   Tests Ordered: No orders of the defined types were placed in this encounter.   Medication Changes: No orders of the defined types were placed in this encounter.   Disposition:  Follow up in 3 month(s)  Signed, Maximino Greenland, MD

## 2019-02-11 ENCOUNTER — Encounter: Payer: Self-pay | Admitting: Internal Medicine

## 2019-02-18 ENCOUNTER — Telehealth: Payer: Self-pay

## 2019-02-18 NOTE — Telephone Encounter (Signed)
I left the pt a detailed message that Dr. Baird Cancer wanted to check and see if the pt scheduled an appt for her mammogram.

## 2019-04-07 ENCOUNTER — Other Ambulatory Visit: Payer: Self-pay

## 2019-04-07 ENCOUNTER — Ambulatory Visit (INDEPENDENT_AMBULATORY_CARE_PROVIDER_SITE_OTHER): Payer: Managed Care, Other (non HMO)

## 2019-04-07 DIAGNOSIS — Z01419 Encounter for gynecological examination (general) (routine) without abnormal findings: Secondary | ICD-10-CM | POA: Diagnosis not present

## 2019-04-08 ENCOUNTER — Encounter: Payer: Self-pay | Admitting: Internal Medicine

## 2019-04-08 ENCOUNTER — Other Ambulatory Visit: Payer: Self-pay | Admitting: Obstetrics & Gynecology

## 2019-04-08 ENCOUNTER — Ambulatory Visit: Payer: Managed Care, Other (non HMO) | Admitting: Internal Medicine

## 2019-04-08 VITALS — BP 112/66 | HR 69 | Temp 98.6°F | Ht 62.0 in | Wt 133.2 lb

## 2019-04-08 DIAGNOSIS — N951 Menopausal and female climacteric states: Secondary | ICD-10-CM | POA: Diagnosis not present

## 2019-04-08 DIAGNOSIS — E559 Vitamin D deficiency, unspecified: Secondary | ICD-10-CM

## 2019-04-08 DIAGNOSIS — R928 Other abnormal and inconclusive findings on diagnostic imaging of breast: Secondary | ICD-10-CM

## 2019-04-08 DIAGNOSIS — D513 Other dietary vitamin B12 deficiency anemia: Secondary | ICD-10-CM | POA: Diagnosis not present

## 2019-04-08 DIAGNOSIS — Z79899 Other long term (current) drug therapy: Secondary | ICD-10-CM | POA: Diagnosis not present

## 2019-04-08 MED ORDER — CYANOCOBALAMIN 1000 MCG/ML IJ SOLN
1000.0000 ug | Freq: Once | INTRAMUSCULAR | Status: AC
  Administered 2019-04-08: 1000 ug via INTRAMUSCULAR

## 2019-04-14 LAB — ESTRADIOL, FREE
Estradiol, Serum, MS: 9.3 pg/mL
Free Estradiol, Percent: 1.7 %
Free Estradiol, Serum: 0.16 pg/mL — ABNORMAL LOW

## 2019-04-14 LAB — CMP14+EGFR
ALT: 20 IU/L (ref 0–32)
AST: 17 IU/L (ref 0–40)
Albumin/Globulin Ratio: 2 (ref 1.2–2.2)
Albumin: 4.7 g/dL (ref 3.8–4.9)
Alkaline Phosphatase: 63 IU/L (ref 39–117)
BUN/Creatinine Ratio: 17 (ref 9–23)
BUN: 13 mg/dL (ref 6–24)
Bilirubin Total: 0.3 mg/dL (ref 0.0–1.2)
CO2: 25 mmol/L (ref 20–29)
Calcium: 9.8 mg/dL (ref 8.7–10.2)
Chloride: 102 mmol/L (ref 96–106)
Creatinine, Ser: 0.78 mg/dL (ref 0.57–1.00)
GFR calc Af Amer: 100 mL/min/{1.73_m2} (ref >59)
GFR calc non Af Amer: 87 mL/min/{1.73_m2} (ref >59)
Globulin, Total: 2.3 g/dL (ref 1.5–4.5)
Glucose: 77 mg/dL (ref 65–99)
Potassium: 4.2 mmol/L (ref 3.5–5.2)
Sodium: 143 mmol/L (ref 134–144)
Total Protein: 7 g/dL (ref 6.0–8.5)

## 2019-04-14 LAB — VITAMIN D 25 HYDROXY (VIT D DEFICIENCY, FRACTURES): Vit D, 25-Hydroxy: 31.2 ng/mL (ref 30.0–100.0)

## 2019-04-14 LAB — PROGESTERONE: Progesterone: 0.7 ng/mL

## 2019-04-14 LAB — VITAMIN B12: Vitamin B-12: 332 pg/mL (ref 232–1245)

## 2019-04-19 NOTE — Progress Notes (Signed)
This visit occurred during the SARS-CoV-2 public health emergency.  Safety protocols were in place, including screening questions prior to the visit, additional usage of staff PPE, and extensive cleaning of exam room while observing appropriate contact time as indicated for disinfecting solutions.  Subjective:     Patient ID: Catherine Cantrell , female    DOB: 1965-04-05 , 54 y.o.   MRN: [id number]   Chief Complaint  Patient presents with  . Hormones f/u    HPI  She is here today for f/u BHRT.  She feels well on her current regimen. She has been taking progesterone nightly, except Sundays. She is also using estriol vaginal cream. She initially noted an improvement in her sx, but now she is starting to feel  "on edge" again.     Past Medical History:  Diagnosis Date  . Hyperlipidemia      Family History  Problem Relation Age of Onset  . Pulmonary Hypertension Mother   . Raynaud syndrome Mother   . Charcot-Marie-Tooth disease Mother   . Diabetes Father   . Breast cancer Maternal Aunt   . Breast cancer Paternal Grandmother   . Diabetes Maternal Grandmother      Current Outpatient Medications:  .  artificial tears (LACRILUBE) OINT ophthalmic ointment, Place into both eyes every 4 (four) hours as needed for dry eyes., Disp: 1 Tube, Rfl: 2 .  omega-3 acid ethyl esters (LOVAZA) 1 g capsule, Take by mouth daily. prn, Disp: , Rfl:  .  Polyethylene Glycol 3350 (MIRALAX PO), Take by mouth., Disp: , Rfl:    Allergies  Allergen Reactions  . Amoxicillin Hives  . Sulfa Antibiotics Hives     Review of Systems  Constitutional: Negative.   Respiratory: Negative.   Cardiovascular: Negative.   Gastrointestinal: Negative.   Neurological: Negative.   Psychiatric/Behavioral: Negative.      Today's Vitals   04/08/19 1543  BP: 112/66  Pulse: 69  Temp: 98.6 F (37 C)  TempSrc: Oral  Weight: 133 lb 3.2 oz (60.4 kg)  Height: 5' 2"  (1.575 m)   Body mass index is 24.36 kg/m.    Objective:  Physical Exam Vitals and nursing note reviewed.  Constitutional:      Appearance: Normal appearance.  HENT:     Head: Normocephalic and atraumatic.  Cardiovascular:     Rate and Rhythm: Normal rate and regular rhythm.     Heart sounds: Normal heart sounds.  Pulmonary:     Effort: Pulmonary effort is normal.     Breath sounds: Normal breath sounds.  Skin:    General: Skin is warm.  Neurological:     General: No focal deficit present.     Mental Status: She is alert.  Psychiatric:        Mood and Affect: Mood normal.        Behavior: Behavior normal.         Assessment And Plan:     1. Female climacteric state  She will continue with her current regimen. I will check serum hormone levels as listed below. She will rto in four months for re-evaluation.   - Estradiol - Progesterone  2. Vitamin D deficiency disease  I WILL CHECK A VIT D LEVEL AND SUPPLEMENT AS NEEDED.  ALSO ENCOURAGED TO SPEND 15 MINUTES IN THE SUN DAILY.  - Vitamin D (25 hydroxy)  3. Other dietary vitamin B12 deficiency anemia  I will check vitamin B12 level. She was also given vitamin B!2 IM x 1.   -  Vitamin B12 - cyanocobalamin ((VITAMIN B-12)) injection 1,000 mcg  4. Drug therapy  - CMP14+EGFR        Maximino Greenland, MD    THE PATIENT IS ENCOURAGED TO PRACTICE SOCIAL DISTANCING DUE TO THE COVID-19 PANDEMIC.

## 2019-04-27 ENCOUNTER — Other Ambulatory Visit: Payer: Self-pay | Admitting: Obstetrics & Gynecology

## 2019-04-27 ENCOUNTER — Ambulatory Visit
Admission: RE | Admit: 2019-04-27 | Discharge: 2019-04-27 | Disposition: A | Discharge status: Home / Self Care | Payer: Managed Care, Other (non HMO) | Source: Ambulatory Visit | Attending: Obstetrics & Gynecology | Admitting: Obstetrics & Gynecology

## 2019-04-27 ENCOUNTER — Other Ambulatory Visit: Payer: Self-pay

## 2019-04-27 DIAGNOSIS — R928 Other abnormal and inconclusive findings on diagnostic imaging of breast: Secondary | ICD-10-CM

## 2019-04-28 ENCOUNTER — Telehealth: Payer: Self-pay | Admitting: Obstetrics & Gynecology

## 2019-04-28 NOTE — Telephone Encounter (Signed)
Called patient to check and see how she is doing after being referred for stereotactic breast biopsy.  Pt will stop the bioidentical hormones she was prescribed until after the results are back.  Also, patient coming in for repeat pap smear.  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  Pt will call and schedule the pap smear.

## 2019-05-04 ENCOUNTER — Other Ambulatory Visit: Payer: Self-pay

## 2019-05-04 ENCOUNTER — Ambulatory Visit
Admission: RE | Admit: 2019-05-04 | Discharge: 2019-05-04 | Disposition: A | Discharge status: Home / Self Care | Payer: Managed Care, Other (non HMO) | Source: Ambulatory Visit | Attending: Obstetrics & Gynecology | Admitting: Obstetrics & Gynecology

## 2019-05-04 DIAGNOSIS — R928 Other abnormal and inconclusive findings on diagnostic imaging of breast: Secondary | ICD-10-CM

## 2019-05-04 HISTORY — PX: BIOPSY BREAST: PRO8

## 2019-05-28 ENCOUNTER — Ambulatory Visit: Payer: Self-pay | Admitting: Surgery

## 2019-05-29 ENCOUNTER — Ambulatory Visit: Payer: Self-pay | Admitting: Surgery

## 2019-05-29 DIAGNOSIS — N6310 Unspecified lump in the right breast, unspecified quadrant: Secondary | ICD-10-CM

## 2019-06-02 ENCOUNTER — Ambulatory Visit: Payer: Self-pay | Admitting: Surgery

## 2019-06-02 ENCOUNTER — Other Ambulatory Visit: Payer: Self-pay | Admitting: Surgery

## 2019-06-02 DIAGNOSIS — N6310 Unspecified lump in the right breast, unspecified quadrant: Secondary | ICD-10-CM

## 2019-06-02 NOTE — H&P (Signed)
History of Present Illness Catherine Cantrell. Catherine Duke MD; 05/29/2019 1:59 PM) The patient is a 54 year old female who presents with a breast mass. Referred by Dr. Silas Cantrell for right breast complex sclerosing lesion PCP - Dr. Emeterio Cantrell  This is a healthy 54 year old female who presents after a recent routine screening mammogram. 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 She presents now to discuss her surgical options.  CLINICAL DATA: Screening.  EXAM: DIGITAL SCREENING BILATERAL MAMMOGRAM WITH TOMO AND CAD  COMPARISON: Previous exam(s).  ACR Breast Density Category c: The breast tissue is heterogeneously dense, which may obscure small masses.  FINDINGS: In the right breast, possible distortion warrants further evaluation. In the left breast, no findings suspicious for malignancy. Images were processed with CAD.  IMPRESSION: Further evaluation is suggested for possible distortion in the right breast.  RECOMMENDATION: Diagnostic mammogram and possibly ultrasound of the right breast. (Code:[id number])  The patient will be contacted regarding the findings, and additional imaging will be scheduled.  BI-RADS CATEGORY 0: Incomplete. Need additional imaging evaluation and/or prior mammograms for comparison.   Electronically Signed By: Catherine Cantrell M.D. On: 04/07/2019 13:45  CLINICAL DATA: Screening recall for a possible right breast distortion.  EXAM: DIGITAL DIAGNOSTIC RIGHT MAMMOGRAM WITH CAD AND TOMO  ULTRASOUND RIGHT BREAST  COMPARISON: Previous exam(s).  ACR Breast Density Category c: The breast tissue is heterogeneously dense, which may obscure small masses.  FINDINGS: Spot compression tomosynthesis images through the upper outer quadrant of the right breast demonstrates a possible subtle distortion in the middle to posterior depth.  Mammographic images were processed with  CAD.  Ultrasound of the upper-outer quadrant of the right breast demonstrates suspicious masses or areas of shadowing. Ultrasound of the right axilla demonstrates multiple normal-appearing lymph nodes.  IMPRESSION: 1. There is a possible subtle distortion in the upper-outer right breast without a sonographic correlate.  2. No evidence of right axillary lymphadenopathy.  RECOMMENDATION: Stereotactic biopsy is recommended for the possible distortion in the upper-outer right breast. I discussed with patient that the distortion is questionable, and may not be evident at the time of biopsy. If this is the case, the biopsy may be canceled and a six-month follow-up diagnostic right breast mammogram would be recommended.  I have discussed the findings and recommendations with the patient. If applicable, a reminder letter will be sent to the patient regarding the next appointment.  BI-RADS CATEGORY 4: Suspicious.   Electronically Signed By: Catherine Cantrell M.D. On: 04/27/2019 11:58  CLINICAL DATA: 54 year old female for tissue sampling of distortion within the UPPER-OUTER RIGHT breast.  EXAM: RIGHT BREAST STEREOTACTIC CORE NEEDLE BIOPSY  COMPARISON: Previous exams.  FINDINGS: The patient and I discussed the procedure of stereotactic-guided biopsy including benefits and alternatives. We discussed the high likelihood of a successful procedure. We discussed the risks of the procedure including infection, bleeding, tissue injury, clip migration, and inadequate sampling. Informed written consent was given. The usual time out protocol was performed immediately prior to the procedure.  Using sterile technique and 1% Lidocaine as local anesthetic, under stereotactic guidance, a 9 gauge vacuum assisted device was used to perform core needle biopsy of distortion within the UPPER-OUTER RIGHT breast using a SUPERIOR approach.  Lesion quadrant: UPPER-OUTER quadrant of the RIGHT  breast  At the conclusion of the procedure, a COIL tissue marker clip was deployed into the biopsy cavity. Follow-up 2-view mammogram was performed and dictated separately.  Following the procedure, the patient experienced a vagal reaction which resolved quickly.  IMPRESSION: Stereotactic-guided biopsy of UPPER-OUTER RIGHT breast distortion. No apparent complications.  Electronically Signed: By: Catherine Cantrell M.D. On: 05/04/2019 11:11  CLINICAL DATA: Evaluate COIL clip placement following stereotactic guided RIGHT breast biopsy.  EXAM: DIAGNOSTIC RIGHT MAMMOGRAM POST STEREOTACTIC BIOPSY  COMPARISON: Previous exam(s).  FINDINGS: Mammographic images were obtained following stereotactic guided biopsy of distortion within the UPPER-OUTER RIGHT breast. The COIL biopsy marking clip is in expected position at the site of biopsy.  IMPRESSION: Appropriate positioning of the COIL shaped biopsy marking clip at the site of biopsy in the UPPER-OUTER RIGHT breast.  Final Assessment: Post Procedure Mammograms for Marker Placement   Electronically Signed By: Catherine Cantrell M.D. On: 05/04/2019 11:12    Problem List/Past Medical Catherine Key K. Marlan Steward, MD; 05/29/2019 1:59 PM) MASS OF RIGHT BREAST ON MAMMOGRAM (N63.10)   Past Surgical History (Catherine Cantrell, [place]; 05/28/2019 10:11 AM) Breast Biopsy  Right. Cesarean Section - Multiple  Foot Surgery  Right. Knee Surgery  Bilateral.  Diagnostic Studies History (Catherine Cantrell, [place]; 05/28/2019 10:11 AM) Mammogram  within last year Pap Smear  1-5 years ago  Allergies (Catherine Cantrell, Cantrell; 05/28/2019 10:11 AM) Amoxicillin *PENICILLINS*  Sulfa Antibiotics  Allergies Reconciled   Medication History (Catherine Cantrell, Cantrell; 05/28/2019 10:12 AM) No Current Medications Medications Reconciled  Social History (Catherine Cantrell, Cantrell; 05/28/2019 10:11 AM) Alcohol use  Moderate alcohol use. No drug use  Tobacco use  Former smoker.  Family History  ([place], [place]; 05/28/2019 10:11 AM) Heart disease in female family member before age 75  Respiratory Condition  Mother.  Pregnancy / Birth History Catherine Cantrell, Cantrell; 05/28/2019 10:11 AM) Age at menarche  11 years. Age of menopause  15-55 Gravida  3 Maternal age  35-20 Para  2  Other Problems Catherine Cantrell. Catherine Ferrie, MD; 05/29/2019 1:59 PM) No pertinent past medical history     Review of Systems ([place]; 05/28/2019 10:11 AM) General Not Present- Appetite Loss, Chills, Fatigue, Fever, Night Sweats, Weight Gain and Weight Loss. Skin Not Present- Change in Wart/Mole, Dryness, Hives, Jaundice, New Lesions, Non-Healing Wounds, Rash and Ulcer. HEENT Present- Ringing in the Ears and Wears glasses/contact lenses. Not Present- Earache, Hearing Loss, Hoarseness, Nose Bleed, Oral Ulcers, Seasonal Allergies, Sinus Pain, Sore Throat, Visual Disturbances and Yellow Eyes. Breast Not Present- Breast Mass, Breast Pain, Nipple Discharge and Skin Changes. Gastrointestinal Present- Constipation. Not Present- Abdominal Pain, Bloating, Bloody Stool, Change in Bowel Habits, Chronic diarrhea, Difficulty Swallowing, Excessive gas, Gets full quickly at meals, Hemorrhoids, Indigestion, Nausea, Rectal Pain and Vomiting. Female Genitourinary Not Present- Frequency, Nocturia, Painful Urination, Pelvic Pain and Urgency. Musculoskeletal Present- Joint Pain. Not Present- Back Pain, Joint Stiffness, Muscle Pain, Muscle Weakness and Swelling of Extremities. Neurological Not Present- Decreased Memory, Fainting, Headaches, Numbness, Seizures, Tingling, Tremor, Trouble walking and Weakness. Psychiatric Present- Change in Sleep Pattern. Not Present- Anxiety, Bipolar, Depression, Fearful and Frequent crying. Endocrine Present- Hot flashes. Not Present- Cold Intolerance, Excessive Hunger, Hair Changes, Heat Intolerance and New Diabetes. Hematology Not Present- Blood Thinners, Easy Bruising, Excessive bleeding, Gland  problems, HIV and Persistent Infections.  Vitals (Catherine Cantrell; 05/28/2019 10:12 AM) 05/28/2019 10:12 AM Weight: 135.25 lb Height: 62in Body Surface Area: 1.62 m Body Mass Index: 24.74 kg/m  Temp.: 97.48F  Pulse: 75 (Regular)  BP: 122/70(Sitting, Left Arm, Standard)       Physical Exam Catherine Key K. Lenn Volker MD; 05/29/2019 2:02 PM) The physical exam findings are as follows: Note:  Constitutional: WDWN in NAD, conversant, no obvious deformities; Eyes: Pupils equal, round; sclera anicteric; moist conjunctiva; no lid lag HENT: Oral mucosa moist; good dentition Neck: No masses palpated, trachea midline; no thyromegaly Breasts: symmetric, no nipple changes, no dominant masses; no axillary lymphadenopathy Lungs: CTA bilaterally; normal respiratory effort CV: Regular rate and rhythm; no murmurs; extremities well-perfused with no edema Abd: +bowel sounds, soft, non-tender, no palpable organomegaly; no palpable hernias Musc: Unable to assess gait; no apparent clubbing or cyanosis in extremities Lymphatic: No palpable cervical or axillary lymphadenopathy Skin: Warm, dry; no sign of jaundice Psychiatric - alert and oriented x 4; calm mood and affect    Assessment & Plan Catherine Key K. Harika Laidlaw MD; 05/29/2019 1:51 PM) MASS OF RIGHT BREAST ON MAMMOGRAM (N63.10) Impression: complex sclerosing lesion Current Plans Schedule for Surgery - right radioactive seed localized lumpectomy. The surgical procedure has been discussed with the patient. Potential risks, benefits, alternative treatments, and expected outcomes have been explained. All of the patient's questions at this time have been answered. The likelihood of reaching the patient's treatment goal is good. The patient understand the proposed surgical procedure and wishes to proceed.  Catherine Cantrell. Catherine Dover, MD, [facility]  General/ Trauma Surgery   06/02/2019 5:47 PM

## 2019-07-06 ENCOUNTER — Encounter (HOSPITAL_BASED_OUTPATIENT_CLINIC_OR_DEPARTMENT_OTHER): Payer: Self-pay | Admitting: Surgery

## 2019-07-06 ENCOUNTER — Other Ambulatory Visit: Payer: Self-pay

## 2019-07-09 ENCOUNTER — Other Ambulatory Visit (HOSPITAL_COMMUNITY): Payer: Managed Care, Other (non HMO)

## 2019-07-09 ENCOUNTER — Other Ambulatory Visit (HOSPITAL_COMMUNITY)
Admission: RE | Admit: 2019-07-09 | Discharge: 2019-07-09 | Disposition: A | Discharge status: Home / Self Care | Payer: Managed Care, Other (non HMO) | Source: Ambulatory Visit | Attending: Surgery | Admitting: Surgery

## 2019-07-09 DIAGNOSIS — Z20822 Contact with and (suspected) exposure to covid-19: Secondary | ICD-10-CM | POA: Diagnosis not present

## 2019-07-09 DIAGNOSIS — Z01812 Encounter for preprocedural laboratory examination: Secondary | ICD-10-CM | POA: Diagnosis present

## 2019-07-10 LAB — SARS CORONAVIRUS 2 (TAT 6-24 HRS): SARS Coronavirus 2: NEGATIVE

## 2019-07-12 ENCOUNTER — Ambulatory Visit
Admission: RE | Admit: 2019-07-12 | Discharge: 2019-07-12 | Disposition: A | Discharge status: Home / Self Care | Payer: Managed Care, Other (non HMO) | Source: Ambulatory Visit | Attending: Surgery | Admitting: Surgery

## 2019-07-12 ENCOUNTER — Other Ambulatory Visit: Payer: Self-pay

## 2019-07-12 DIAGNOSIS — N6310 Unspecified lump in the right breast, unspecified quadrant: Secondary | ICD-10-CM

## 2019-07-12 NOTE — Progress Notes (Signed)

## 2019-07-13 ENCOUNTER — Encounter (HOSPITAL_BASED_OUTPATIENT_CLINIC_OR_DEPARTMENT_OTHER)
Admission: RE | Disposition: A | Discharge status: Home / Self Care | Payer: Self-pay | Source: Home / Self Care | Attending: Surgery

## 2019-07-13 ENCOUNTER — Ambulatory Visit
Admission: RE | Admit: 2019-07-13 | Discharge: 2019-07-13 | Disposition: A | Discharge status: Home / Self Care | Payer: Managed Care, Other (non HMO) | Source: Ambulatory Visit | Attending: Surgery | Admitting: Surgery

## 2019-07-13 ENCOUNTER — Ambulatory Visit (HOSPITAL_BASED_OUTPATIENT_CLINIC_OR_DEPARTMENT_OTHER): Payer: Managed Care, Other (non HMO) | Admitting: Certified Registered"

## 2019-07-13 ENCOUNTER — Other Ambulatory Visit: Payer: Self-pay

## 2019-07-13 ENCOUNTER — Ambulatory Visit (HOSPITAL_BASED_OUTPATIENT_CLINIC_OR_DEPARTMENT_OTHER)
Admission: RE | Admit: 2019-07-13 | Discharge: 2019-07-13 | Disposition: A | Discharge status: Home / Self Care | Payer: Managed Care, Other (non HMO) | Attending: Surgery | Admitting: Surgery

## 2019-07-13 ENCOUNTER — Encounter (HOSPITAL_BASED_OUTPATIENT_CLINIC_OR_DEPARTMENT_OTHER): Payer: Self-pay | Admitting: Surgery

## 2019-07-13 DIAGNOSIS — Z87891 Personal history of nicotine dependence: Secondary | ICD-10-CM | POA: Insufficient documentation

## 2019-07-13 DIAGNOSIS — Z882 Allergy status to sulfonamides status: Secondary | ICD-10-CM | POA: Insufficient documentation

## 2019-07-13 DIAGNOSIS — N6489 Other specified disorders of breast: Secondary | ICD-10-CM | POA: Insufficient documentation

## 2019-07-13 DIAGNOSIS — Z880 Allergy status to penicillin: Secondary | ICD-10-CM | POA: Insufficient documentation

## 2019-07-13 DIAGNOSIS — N6310 Unspecified lump in the right breast, unspecified quadrant: Secondary | ICD-10-CM

## 2019-07-13 HISTORY — PX: BREAST LUMPECTOMY WITH RADIOACTIVE SEED LOCALIZATION: SHX6424

## 2019-07-13 HISTORY — DX: Unspecified lump in the right breast, unspecified quadrant: N63.10

## 2019-07-13 HISTORY — PX: BREAST EXCISIONAL BIOPSY: SUR124

## 2019-07-13 SURGERY — BREAST LUMPECTOMY WITH RADIOACTIVE SEED LOCALIZATION
Anesthesia: General | Site: Breast | Laterality: Right

## 2019-07-13 MED ORDER — CEFAZOLIN SODIUM-DEXTROSE 2-4 GM/100ML-% IV SOLN: 2.0000 g | INTRAVENOUS | Status: DC

## 2019-07-13 MED ORDER — ACETAMINOPHEN 500 MG PO TABS
1000.0000 mg | ORAL_TABLET | ORAL | Status: AC
  Administered 2019-07-13: 1000 mg via ORAL

## 2019-07-13 MED ORDER — GABAPENTIN 300 MG PO CAPS
300.0000 mg | ORAL_CAPSULE | ORAL | Status: AC
  Administered 2019-07-13: 300 mg via ORAL

## 2019-07-13 MED ORDER — MIDAZOLAM HCL 5 MG/5ML IJ SOLN
INTRAMUSCULAR | Status: DC | PRN
  Administered 2019-07-13: 2 mg via INTRAVENOUS

## 2019-07-13 MED ORDER — OXYCODONE HCL 5 MG PO TABS
5.0000 mg | ORAL_TABLET | Freq: Once | ORAL | Status: AC
  Administered 2019-07-13: 5 mg via ORAL

## 2019-07-13 MED ORDER — FENTANYL CITRATE (PF) 100 MCG/2ML IJ SOLN
INTRAMUSCULAR | Status: DC | PRN
  Administered 2019-07-13 (×2): 50 ug via INTRAVENOUS

## 2019-07-13 MED ORDER — BUPIVACAINE HCL (PF) 0.25 % IJ SOLN
INTRAMUSCULAR | Status: DC | PRN
  Administered 2019-07-13: 10 mL

## 2019-07-13 MED ORDER — CHLORHEXIDINE GLUCONATE CLOTH 2 % EX PADS: 6.0000 | MEDICATED_PAD | Freq: Once | CUTANEOUS | Status: DC

## 2019-07-13 MED ORDER — PROPOFOL 10 MG/ML IV BOLUS
INTRAVENOUS | Status: DC | PRN
  Administered 2019-07-13: 180 mg via INTRAVENOUS

## 2019-07-13 MED ORDER — ONDANSETRON HCL 4 MG/2ML IJ SOLN
4.0000 mg | Freq: Once | INTRAMUSCULAR | Status: AC | PRN
  Administered 2019-07-13: 4 mg via INTRAVENOUS

## 2019-07-13 MED ORDER — LACTATED RINGERS IV SOLN
INTRAVENOUS | Status: DC | PRN
Start: 2019-07-13 — End: 2019-07-13

## 2019-07-13 MED ORDER — HYDROMORPHONE HCL 1 MG/ML IJ SOLN
INTRAMUSCULAR | Status: AC
  Filled 2019-07-13: qty 0.5

## 2019-07-13 MED ORDER — LIDOCAINE 2% (20 MG/ML) 5 ML SYRINGE
INTRAMUSCULAR | Status: AC
  Filled 2019-07-13: qty 5

## 2019-07-13 MED ORDER — ONDANSETRON HCL 4 MG/2ML IJ SOLN
INTRAMUSCULAR | Status: AC
  Filled 2019-07-13: qty 2

## 2019-07-13 MED ORDER — EPHEDRINE SULFATE 50 MG/ML IJ SOLN
INTRAMUSCULAR | Status: DC | PRN
Start: 2019-07-13 — End: 2019-07-13
  Administered 2019-07-13 (×2): 10 mg via INTRAVENOUS

## 2019-07-13 MED ORDER — LIDOCAINE HCL (CARDIAC) PF 100 MG/5ML IV SOSY
PREFILLED_SYRINGE | INTRAVENOUS | Status: DC | PRN
  Administered 2019-07-13: 100 mg via INTRAVENOUS

## 2019-07-13 MED ORDER — MIDAZOLAM HCL 2 MG/2ML IJ SOLN
INTRAMUSCULAR | Status: AC
  Filled 2019-07-13: qty 2

## 2019-07-13 MED ORDER — CEFAZOLIN SODIUM-DEXTROSE 2-4 GM/100ML-% IV SOLN
INTRAVENOUS | Status: AC
  Filled 2019-07-13: qty 100

## 2019-07-13 MED ORDER — MEPERIDINE HCL 25 MG/ML IJ SOLN: 6.2500 mg | INTRAMUSCULAR | Status: DC | PRN

## 2019-07-13 MED ORDER — FENTANYL CITRATE (PF) 100 MCG/2ML IJ SOLN
INTRAMUSCULAR | Status: AC
  Filled 2019-07-13: qty 2

## 2019-07-13 MED ORDER — OXYCODONE HCL 5 MG PO TABS
ORAL_TABLET | ORAL | Status: AC
  Filled 2019-07-13: qty 1

## 2019-07-13 MED ORDER — HYDROMORPHONE HCL 1 MG/ML IJ SOLN
0.2500 mg | INTRAMUSCULAR | Status: DC | PRN
  Administered 2019-07-13 (×2): 0.25 mg via INTRAVENOUS

## 2019-07-13 MED ORDER — LACTATED RINGERS IV SOLN: INTRAVENOUS | Status: DC

## 2019-07-13 MED ORDER — HYDROCODONE-ACETAMINOPHEN 5-325 MG PO TABS
1.0000 | ORAL_TABLET | Freq: Four times a day (QID) | ORAL | 0 refills | Status: DC | PRN
Start: 2019-07-13 — End: 2019-11-09

## 2019-07-13 MED ORDER — ACETAMINOPHEN 500 MG PO TABS
ORAL_TABLET | ORAL | Status: AC
  Filled 2019-07-13: qty 2

## 2019-07-13 MED ORDER — DEXAMETHASONE SODIUM PHOSPHATE 10 MG/ML IJ SOLN
INTRAMUSCULAR | Status: AC
  Filled 2019-07-13: qty 1

## 2019-07-13 MED ORDER — GABAPENTIN 300 MG PO CAPS
ORAL_CAPSULE | ORAL | Status: AC
  Filled 2019-07-13: qty 1

## 2019-07-13 SURGICAL SUPPLY — 55 items
APL PRP STRL LF DISP 70% ISPRP (MISCELLANEOUS) ×1
APL SKNCLS STERI-STRIP NONHPOA (GAUZE/BANDAGES/DRESSINGS) ×1
APPLIER CLIP 9.375 MED OPEN (MISCELLANEOUS)
APR CLP MED 9.3 20 MLT OPN (MISCELLANEOUS)
BENZOIN TINCTURE PRP APPL 2/3 (GAUZE/BANDAGES/DRESSINGS) ×2 IMPLANT
BLADE HEX COATED 2.75 (ELECTRODE) ×1 IMPLANT
BLADE SURG 15 STRL LF DISP TIS (BLADE) ×1 IMPLANT
BLADE SURG 15 STRL SS (BLADE) ×2
CANISTER SUC SOCK COL 7IN (MISCELLANEOUS) IMPLANT
CANISTER SUCT 1200ML W/VALVE (MISCELLANEOUS) IMPLANT
CHLORAPREP W/TINT 26 (MISCELLANEOUS) ×2 IMPLANT
CLIP APPLIE 9.375 MED OPEN (MISCELLANEOUS) ×1 IMPLANT
COVER BACK TABLE 60X90IN (DRAPES) ×2 IMPLANT
COVER MAYO STAND STRL (DRAPES) ×2 IMPLANT
COVER PROBE W GEL 5X96 (DRAPES) ×2 IMPLANT
COVER WAND RF STERILE (DRAPES) IMPLANT
DECANTER SPIKE VIAL GLASS SM (MISCELLANEOUS) IMPLANT
DRAPE LAPAROTOMY 100X72 PEDS (DRAPES) ×2 IMPLANT
DRAPE UTILITY XL STRL (DRAPES) ×2 IMPLANT
DRSG TEGADERM 4X4.75 (GAUZE/BANDAGES/DRESSINGS) ×2 IMPLANT
ELECT COATED BLADE 2.86 ST (ELECTRODE) ×1 IMPLANT
ELECT REM PT RETURN 9FT ADLT (ELECTROSURGICAL) ×2
ELECTRODE REM PT RTRN 9FT ADLT (ELECTROSURGICAL) ×1 IMPLANT
GAUZE SPONGE 4X4 12PLY STRL LF (GAUZE/BANDAGES/DRESSINGS) ×1 IMPLANT
GLOVE BIO SURGEON STRL SZ 6.5 (GLOVE) ×1 IMPLANT
GLOVE BIO SURGEON STRL SZ7 (GLOVE) ×2 IMPLANT
GLOVE BIOGEL PI IND STRL 7.0 (GLOVE) IMPLANT
GLOVE BIOGEL PI IND STRL 7.5 (GLOVE) ×1 IMPLANT
GLOVE BIOGEL PI INDICATOR 7.0 (GLOVE) ×1
GLOVE BIOGEL PI INDICATOR 7.5 (GLOVE) ×1
GOWN STRL REUS W/ TWL LRG LVL3 (GOWN DISPOSABLE) ×2 IMPLANT
GOWN STRL REUS W/TWL LRG LVL3 (GOWN DISPOSABLE) ×4
ILLUMINATOR WAVEGUIDE N/F (MISCELLANEOUS) IMPLANT
KIT MARKER MARGIN INK (KITS) ×2 IMPLANT
LIGHT WAVEGUIDE WIDE FLAT (MISCELLANEOUS) IMPLANT
NDL HYPO 25X1 1.5 SAFETY (NEEDLE) ×1 IMPLANT
NEEDLE HYPO 25X1 1.5 SAFETY (NEEDLE) ×2 IMPLANT
NS IRRIG 1000ML POUR BTL (IV SOLUTION) ×1 IMPLANT
PENCIL SMOKE EVACUATOR (MISCELLANEOUS) ×2 IMPLANT
SET BASIN DAY SURGERY F.S. (CUSTOM PROCEDURE TRAY) ×2 IMPLANT
SLEEVE SCD COMPRESS KNEE MED (MISCELLANEOUS) ×2 IMPLANT
SPONGE GAUZE 2X2 8PLY STRL LF (GAUZE/BANDAGES/DRESSINGS) IMPLANT
SPONGE LAP 18X18 RF (DISPOSABLE) IMPLANT
SPONGE LAP 4X18 RFD (DISPOSABLE) ×2 IMPLANT
STRIP CLOSURE SKIN 1/2X4 (GAUZE/BANDAGES/DRESSINGS) ×2 IMPLANT
SUT MON AB 4-0 PC3 18 (SUTURE) ×2 IMPLANT
SUT SILK 2 0 SH (SUTURE) IMPLANT
SUT VIC AB 3-0 SH 27 (SUTURE) ×2
SUT VIC AB 3-0 SH 27X BRD (SUTURE) ×1 IMPLANT
SYR BULB EAR ULCER 3OZ GRN STR (SYRINGE) IMPLANT
SYR CONTROL 10ML LL (SYRINGE) ×2 IMPLANT
TOWEL GREEN STERILE FF (TOWEL DISPOSABLE) ×2 IMPLANT
TRAY FAXITRON CT DISP (TRAY / TRAY PROCEDURE) ×2 IMPLANT
TUBE CONNECTING 20X1/4 (TUBING) IMPLANT
YANKAUER SUCT BULB TIP NO VENT (SUCTIONS) IMPLANT

## 2019-07-13 NOTE — Anesthesia Procedure Notes (Signed)
Procedure Name: LMA Insertion Performed by: Sharhonda Atwood M, CRNA Pre-anesthesia Checklist: Patient identified, Emergency Drugs available, Suction available and Patient being monitored Patient Re-evaluated:Patient Re-evaluated prior to induction Oxygen Delivery Method: Circle system utilized Preoxygenation: Pre-oxygenation with 100% oxygen Induction Type: IV induction Ventilation: Mask ventilation without difficulty LMA: LMA inserted LMA Size: 4.0 Number of attempts: 1 Airway Equipment and Method: Bite block Placement Confirmation: positive ETCO2 Tube secured with: Tape Dental Injury: Teeth and Oropharynx as per pre-operative assessment        

## 2019-07-13 NOTE — Op Note (Signed)
Pre-op Diagnosis:  Complex sclerosing lesion - right breast Post-op Diagnosis: same Procedure:  Right radioactive seed localized lumpectomy Surgeon:  Ariatna Jester K. Anesthesia:  GEN - LMA Indications:  This is a healthy 54 year old female who presents after a recent routine screening mammogram. 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  She presents now for excision.  A radioactive seed was placed yesterday.  Description of procedure: The patient is brought to the operating room placed in supine position on the operating room table. After an adequate level of general anesthesia was obtained, her right breast was prepped with ChloraPrep and draped in sterile fashion. A timeout was taken to ensure the proper patient and proper procedure. We interrogated the breast with the neoprobe. We made a circumareolar incision around the upper side of the nipple after infiltrating with 0.25% Marcaine. Dissection was carried down in the breast tissue with cautery. We used the neoprobe to guide [place] towards the radioactive seed. We excised an area of tissue around the radioactive seed 1.5 cm in diameter. The specimen was removed and was oriented with a paint kit. Specimen mammogram showed the radioactive seed as well as the biopsy clip within the specimen. This was sent for pathologic examination. There is no residual radioactivity within the biopsy cavity. We inspected carefully for hemostasis. The wound was thoroughly irrigated. The wound was closed with a deep layer of 3-0 Vicryl and a subcuticular layer of 4-0 Monocryl. Benzoin Steri-Strips were applied. The patient was then extubated and brought to the recovery room in stable condition. All sponge, instrument, and needle counts are correct.  Imogene Burn. Georgette Dover, MD, [facility]  General/ Trauma Surgery  07/13/2019 12:32 PM

## 2019-07-13 NOTE — Anesthesia Postprocedure Evaluation (Signed)
Anesthesia Post Note  Patient: Catherine Cantrell  Procedure(s) Performed: RIGHT BREAST LUMPECTOMY WITH RADIOACTIVE SEED LOCALIZATION (Right Breast)     Patient location during evaluation: PACU Anesthesia Type: General Level of consciousness: awake and alert Pain management: pain level controlled Vital Signs Assessment: post-procedure vital signs reviewed and stable Respiratory status: spontaneous breathing, nonlabored ventilation, respiratory function stable and patient connected to nasal cannula oxygen Cardiovascular status: blood pressure returned to baseline and stable Postop Assessment: no apparent nausea or vomiting Anesthetic complications: no   No complications documented.  Last Vitals:  Vitals:   07/13/19 1315 07/13/19 1320  BP: 114/75   Pulse: 60 70  Resp: 12 20  Temp:    SpO2: 100% 100%    Last Pain:  Vitals:   07/13/19 1320  TempSrc:   PainSc: 5                  [name]

## 2019-07-13 NOTE — Anesthesia Preprocedure Evaluation (Signed)
Anesthesia Evaluation  Patient identified by MRN, date of birth, ID band Patient awake    Reviewed: Allergy & Precautions, NPO status , Patient's Chart, lab work & pertinent test results  Airway Mallampati: I  TM Distance: >3 FB Neck ROM: Full    Dental   Pulmonary former smoker,    Pulmonary exam normal        Cardiovascular Normal cardiovascular exam     Neuro/Psych    GI/Hepatic GERD  Medicated and Controlled,  Endo/Other    Renal/GU      Musculoskeletal   Abdominal   Peds  Hematology   Anesthesia Other Findings   Reproductive/Obstetrics                             Anesthesia Physical Anesthesia Plan  ASA: II  Anesthesia Plan: General   Post-op Pain Management:    Induction: Intravenous  PONV Risk Score and Plan: 3 and Ondansetron, Midazolam and Dexamethasone  Airway Management Planned: LMA  Additional Equipment:   Intra-op Plan:   Post-operative Plan: Extubation in OR  Informed Consent: I have reviewed the patients History and Physical, chart, labs and discussed the procedure including the risks, benefits and alternatives for the proposed anesthesia with the patient or authorized representative who has indicated his/her understanding and acceptance.       Plan Discussed with: CRNA and Surgeon  Anesthesia Plan Comments:         Anesthesia Quick Evaluation

## 2019-07-13 NOTE — Discharge Instructions (Signed)
[place] Office Phone Number [phone]  BREAST BIOPSY/ PARTIAL MASTECTOMY: POST OP INSTRUCTIONS  Always review your discharge instruction sheet given to you by the facility where your surgery was performed.  IF YOU HAVE DISABILITY OR FAMILY LEAVE FORMS, YOU MUST BRING THEM TO THE OFFICE FOR PROCESSING.  DO NOT GIVE THEM TO YOUR DOCTOR.  1. A prescription for pain medication may be given to you upon discharge.  Take your pain medication as prescribed, if needed.  If narcotic pain medicine is not needed, then you may take acetaminophen (Tylenol) or ibuprofen (Advil) as needed. 2. Take your usually prescribed medications unless otherwise directed 3. If you need a refill on your pain medication, please contact your pharmacy.  They will contact our office to request authorization.  Prescriptions will not be filled after 5pm or on week-ends. 4. You should eat very light the first 24 hours after surgery, such as soup, crackers, pudding, etc.  Resume your normal diet the day after surgery. 5. Most patients will experience some swelling and bruising in the breast.  Ice packs and a good support bra will help.  Swelling and bruising can take several days to resolve.  6. It is common to experience some constipation if taking pain medication after surgery.  Increasing fluid intake and taking a stool softener will usually help or prevent this problem from occurring.  A mild laxative (Milk of Magnesia or Miralax) should be taken according to package directions if there are no bowel movements after 48 hours. 7. Unless discharge instructions indicate otherwise, you may remove your bandages 24-48 hours after surgery, and you may shower at that time.  You may have steri-strips (small skin tapes) in place directly over the incision.  These strips should be left on the skin for 7-10 days.  If your surgeon used skin glue on the incision, you may shower in 24 hours.  The glue will flake off over the  next 2-3 weeks.  Any sutures or staples will be removed at the office during your follow-up visit. 8. ACTIVITIES:  You may resume regular daily activities (gradually increasing) beginning the next day.  Wearing a good support bra or sports bra minimizes pain and swelling.  You may have sexual intercourse when it is comfortable. a. You may drive when you no longer are taking prescription pain medication, you can comfortably wear a seatbelt, and you can safely maneuver your car and apply brakes. b. RETURN TO WORK:  ______________________________________________________________________________________ 9. You should see your doctor in the office for a follow-up appointment approximately two weeks after your surgery.  Your doctor's nurse will typically make your follow-up appointment when she calls you with your pathology report.  Expect your pathology report 2-3 business days after your surgery.  You may call to check if you do not hear from [place] after three days. 10. OTHER INSTRUCTIONS: _______________________________________________________________________________________________ _____________________________________________________________________________________________________________________________________ _____________________________________________________________________________________________________________________________________ _____________________________________________________________________________________________________________________________________  WHEN TO CALL YOUR DOCTOR: 1. Fever over 101.0 2. Nausea and/or vomiting. 3. Extreme swelling or bruising. 4. Continued bleeding from incision. 5. Increased pain, redness, or drainage from the incision.  The clinic staff is available to answer your questions during regular business hours.  Please don't hesitate to call and ask to speak to one of the nurses for clinical concerns.  If you have a medical emergency, go to the nearest  emergency room or call 911.  A surgeon from [facility] is always on call at the hospital.  For further questions, please visit [email or web address]  NO TYLENOL PRODUCTS UNTIL  5:30 PM     Post Anesthesia Home Care Instructions  Activity: Get plenty of rest for the remainder of the day. A responsible individual must stay with you for 24 hours following the procedure.  For the next 24 hours, DO NOT: -Drive a car -Paediatric nurse -Drink alcoholic beverages -Take any medication unless instructed by your physician -Make any legal decisions or sign important papers.  Meals: Start with liquid foods such as gelatin or soup. Progress to regular foods as tolerated. Avoid greasy, spicy, heavy foods. If nausea and/or vomiting occur, drink only clear liquids until the nausea and/or vomiting subsides. Call your physician if vomiting continues.  Special Instructions/Symptoms: Your throat may feel dry or sore from the anesthesia or the breathing tube placed in your throat during surgery. If this causes discomfort, gargle with warm salt water. The discomfort should disappear within 24 hours.  If you had a scopolamine patch placed behind your ear for the management of post- operative nausea and/or vomiting:  1. The medication in the patch is effective for 72 hours, after which it should be removed.  Wrap patch in a tissue and discard in the trash. Wash hands thoroughly with soap and water. 2. You may remove the patch earlier than 72 hours if you experience unpleasant side effects which may include dry mouth, dizziness or visual disturbances. 3. Avoid touching the patch. Wash your hands with soap and water after contact with the patch.

## 2019-07-13 NOTE — H&P (Signed)
History of Present Illness  Referred by Dr. Silas Sacramento for right breast complex sclerosing lesion PCP - Dr. Emeterio Reeve  This is a healthy 54 year old female who presents after a recent routine screening mammogram. 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 She presents now to discuss her surgical options.  CLINICAL DATA: Screening.  EXAM: DIGITAL SCREENING BILATERAL MAMMOGRAM WITH TOMO AND CAD  COMPARISON: Previous exam(s).  ACR Breast Density Category c: The breast tissue is heterogeneously dense, which may obscure small masses.  FINDINGS: In the right breast, possible distortion warrants further evaluation. In the left breast, no findings suspicious for malignancy. Images were processed with CAD.  IMPRESSION: Further evaluation is suggested for possible distortion in the right breast.  RECOMMENDATION: Diagnostic mammogram and possibly ultrasound of the right breast. (Code:[id number])  The patient will be contacted regarding the findings, and additional imaging will be scheduled.  BI-RADS CATEGORY 0: Incomplete. Need additional imaging evaluation and/or prior mammograms for comparison.   Electronically Signed By: Kristopher Oppenheim M.D. On: 04/07/2019 13:45  CLINICAL DATA: Screening recall for a possible right breast distortion.  EXAM: DIGITAL DIAGNOSTIC RIGHT MAMMOGRAM WITH CAD AND TOMO  ULTRASOUND RIGHT BREAST  COMPARISON: Previous exam(s).  ACR Breast Density Category c: The breast tissue is heterogeneously dense, which may obscure small masses.  FINDINGS: Spot compression tomosynthesis images through the upper outer quadrant of the right breast demonstrates a possible subtle distortion in the middle to posterior depth.  Mammographic images were processed with CAD.  Ultrasound of the upper-outer quadrant of the right breast demonstrates  suspicious masses or areas of shadowing. Ultrasound of the right axilla demonstrates multiple normal-appearing lymph nodes.  IMPRESSION: 1. There is a possible subtle distortion in the upper-outer right breast without a sonographic correlate.  2. No evidence of right axillary lymphadenopathy.  RECOMMENDATION: Stereotactic biopsy is recommended for the possible distortion in the upper-outer right breast. I discussed with patient that the distortion is questionable, and may not be evident at the time of biopsy. If this is the case, the biopsy may be canceled and a six-month follow-up diagnostic right breast mammogram would be recommended.  I have discussed the findings and recommendations with the patient. If applicable, a reminder letter will be sent to the patient regarding the next appointment.  BI-RADS CATEGORY 4: Suspicious.   Electronically Signed By: Ammie Ferrier M.D. On: 04/27/2019 11:58  CLINICAL DATA: 54 year old female for tissue sampling of distortion within the UPPER-OUTER RIGHT breast.  EXAM: RIGHT BREAST STEREOTACTIC CORE NEEDLE BIOPSY  COMPARISON: Previous exams.  FINDINGS: The patient and I discussed the procedure of stereotactic-guided biopsy including benefits and alternatives. We discussed the high likelihood of a successful procedure. We discussed the risks of the procedure including infection, bleeding, tissue injury, clip migration, and inadequate sampling. Informed written consent was given. The usual time out protocol was performed immediately prior to the procedure.  Using sterile technique and 1% Lidocaine as local anesthetic, under stereotactic guidance, a 9 gauge vacuum assisted device was used to perform core needle biopsy of distortion within the UPPER-OUTER RIGHT breast using a SUPERIOR approach.  Lesion quadrant: UPPER-OUTER quadrant of the RIGHT breast  At the conclusion of the procedure, a COIL tissue marker  clip was deployed into the biopsy cavity. Follow-up 2-view mammogram was performed and dictated separately.  Following the procedure, the patient experienced a vagal reaction which resolved quickly.  IMPRESSION: Stereotactic-guided biopsy of UPPER-OUTER RIGHT  breast distortion. No apparent complications.  Electronically Signed: By: Margarette Canada M.D. On: 05/04/2019 11:11  CLINICAL DATA: Evaluate COIL clip placement following stereotactic guided RIGHT breast biopsy.  EXAM: DIAGNOSTIC RIGHT MAMMOGRAM POST STEREOTACTIC BIOPSY  COMPARISON: Previous exam(s).  FINDINGS: Mammographic images were obtained following stereotactic guided biopsy of distortion within the UPPER-OUTER RIGHT breast. The COIL biopsy marking clip is in expected position at the site of biopsy.  IMPRESSION: Appropriate positioning of the COIL shaped biopsy marking clip at the site of biopsy in the UPPER-OUTER RIGHT breast.  Final Assessment: Post Procedure Mammograms for Marker Placement   Electronically Signed By: Margarette Canada M.D. On: 05/04/2019 11:12    Problem List/Past Medical  MASS OF RIGHT BREAST ON MAMMOGRAM (N63.10)   Past Surgical History  Breast Biopsy  Right. Cesarean Section - Multiple  Foot Surgery  Right. Knee Surgery  Bilateral.  Diagnostic Studies History Mammogram  within last year Pap Smear  1-5 years ago  Allergies Amoxicillin *PENICILLINS*  Sulfa Antibiotics  Allergies Reconciled   Medication History  No Current Medications Medications Reconciled  Social History  Alcohol use  Moderate alcohol use. No drug use  Tobacco use  Former smoker.  Family History Heart disease in female family member before age 88  Respiratory Condition  Mother.  Pregnancy / Birth History  Age at menarche  67 years. Age of menopause  60-55 Gravida  3 Maternal age  44-20 Para  2  Other Problems  No pertinent past medical history      Review of Systems  General Not Present- Appetite Loss, Chills, Fatigue, Fever, Night Sweats, Weight Gain and Weight Loss. Skin Not Present- Change in Wart/Mole, Dryness, Hives, Jaundice, New Lesions, Non-Healing Wounds, Rash and Ulcer. HEENT Present- Ringing in the Ears and Wears glasses/contact lenses. Not Present- Earache, Hearing Loss, Hoarseness, Nose Bleed, Oral Ulcers, Seasonal Allergies, Sinus Pain, Sore Throat, Visual Disturbances and Yellow Eyes. Breast Not Present- Breast Mass, Breast Pain, Nipple Discharge and Skin Changes. Gastrointestinal Present- Constipation. Not Present- Abdominal Pain, Bloating, Bloody Stool, Change in Bowel Habits, Chronic diarrhea, Difficulty Swallowing, Excessive gas, Gets full quickly at meals, Hemorrhoids, Indigestion, Nausea, Rectal Pain and Vomiting. Female Genitourinary Not Present- Frequency, Nocturia, Painful Urination, Pelvic Pain and Urgency. Musculoskeletal Present- Joint Pain. Not Present- Back Pain, Joint Stiffness, Muscle Pain, Muscle Weakness and Swelling of Extremities. Neurological Not Present- Decreased Memory, Fainting, Headaches, Numbness, Seizures, Tingling, Tremor, Trouble walking and Weakness. Psychiatric Present- Change in Sleep Pattern. Not Present- Anxiety, Bipolar, Depression, Fearful and Frequent crying. Endocrine Present- Hot flashes. Not Present- Cold Intolerance, Excessive Hunger, Hair Changes, Heat Intolerance and New Diabetes. Hematology Not Present- Blood Thinners, Easy Bruising, Excessive bleeding, Gland problems, HIV and Persistent Infections.  Vitals  Weight: 135.25 lb Height: 62in Body Surface Area: 1.62 m Body Mass Index: 24.74 kg/m  Temp.: 97.80F  Pulse: 75 (Regular)  BP: 122/70(Sitting, Left Arm, Standard)       Physical Exam  The physical exam findings are as follows: Note: Constitutional: WDWN in NAD, conversant, no obvious deformities; Eyes: Pupils equal, round; sclera  anicteric; moist conjunctiva; no lid lag HENT: Oral mucosa moist; good dentition Neck: No masses palpated, trachea midline; no thyromegaly Breasts: symmetric, no nipple changes, no dominant masses; no axillary lymphadenopathy Lungs: CTA bilaterally; normal respiratory effort CV: Regular rate and rhythm; no murmurs; extremities well-perfused with no edema Abd: +bowel sounds, soft, non-tender, no palpable organomegaly; no palpable hernias Musc: Unable to assess gait; no apparent clubbing or cyanosis in extremities Lymphatic: No palpable  cervical or axillary lymphadenopathy Skin: Warm, dry; no sign of jaundice Psychiatric - alert and oriented x 4; calm mood and affect    Assessment & Plan  MASS OF RIGHT BREAST ON MAMMOGRAM (N63.10) Impression: complex sclerosing lesion Current Plans Schedule for Surgery - right radioactive seed localized lumpectomy. The surgical procedure has been discussed with the patient. Potential risks, benefits, alternative treatments, and expected outcomes have been explained. All of the patient's questions at this time have been answered. The likelihood of reaching the patient's treatment goal is good. The patient understand the proposed surgical procedure and wishes to proceed.  Catherine Cantrell. Georgette Dover, MD, [facility] Surgery  General/ Trauma Surgery   07/13/2019 11:20 AM

## 2019-07-13 NOTE — Transfer of Care (Signed)
Immediate Anesthesia Transfer of Care Note  Patient: Catherine Cantrell  Procedure(s) Performed: RIGHT BREAST LUMPECTOMY WITH RADIOACTIVE SEED LOCALIZATION (Right Breast)  Patient Location: PACU  Anesthesia Type:General  Level of Consciousness: awake, alert  and oriented  Airway & Oxygen Therapy: Patient Spontanous Breathing and Patient connected to face mask oxygen  Post-op Assessment: Report given to RN and Post -op Vital signs reviewed and stable  Post vital signs: Reviewed and stable  Last Vitals:  Vitals Value Taken Time  BP 117/59 07/13/19 1238  Temp    Pulse 82 07/13/19 1239  Resp 13 07/13/19 1239  SpO2 100 % 07/13/19 1239  Vitals shown include unvalidated device data.  Last Pain:  Vitals:   07/13/19 1122  TempSrc: Oral  PainSc: 0-No pain         Complications: No complications documented.

## 2019-07-14 ENCOUNTER — Encounter (HOSPITAL_BASED_OUTPATIENT_CLINIC_OR_DEPARTMENT_OTHER): Payer: Self-pay | Admitting: Surgery

## 2019-07-15 LAB — SURGICAL PATHOLOGY

## 2019-08-09 ENCOUNTER — Ambulatory Visit: Payer: Managed Care, Other (non HMO) | Admitting: Internal Medicine

## 2019-09-01 ENCOUNTER — Telehealth: Payer: Self-pay

## 2019-09-01 NOTE — Telephone Encounter (Signed)
Sure Recommend COVID vaccine also. I don't have anything on file for her

## 2019-09-01 NOTE — Telephone Encounter (Signed)
Catherine Cantrell is traveling on a Cruise. She states she needs a COVID-19 PCR test prior to departure. Please advise if ok to order test and schedule a nurse visit for collection.

## 2019-09-02 NOTE — Telephone Encounter (Signed)
Left message advising of recommendations.  

## 2019-11-09 ENCOUNTER — Other Ambulatory Visit: Payer: Self-pay

## 2019-11-09 ENCOUNTER — Encounter: Payer: Self-pay | Admitting: Medical-Surgical

## 2019-11-09 ENCOUNTER — Ambulatory Visit (INDEPENDENT_AMBULATORY_CARE_PROVIDER_SITE_OTHER): Payer: Managed Care, Other (non HMO) | Admitting: Medical-Surgical

## 2019-11-09 VITALS — BP 127/76 | HR 68 | Temp 98.1°F | Ht 63.0 in | Wt 142.6 lb

## 2019-11-09 DIAGNOSIS — F5105 Insomnia due to other mental disorder: Secondary | ICD-10-CM

## 2019-11-09 DIAGNOSIS — Z1211 Encounter for screening for malignant neoplasm of colon: Secondary | ICD-10-CM | POA: Diagnosis not present

## 2019-11-09 DIAGNOSIS — Z8639 Personal history of other endocrine, nutritional and metabolic disease: Secondary | ICD-10-CM | POA: Diagnosis not present

## 2019-11-09 DIAGNOSIS — Z0000 Encounter for general adult medical examination without abnormal findings: Secondary | ICD-10-CM

## 2019-11-09 DIAGNOSIS — F99 Mental disorder, not otherwise specified: Secondary | ICD-10-CM

## 2019-11-09 MED ORDER — NICOTINE 14 MG/24HR TD PT24: 14.0000 mg | MEDICATED_PATCH | Freq: Every day | TRANSDERMAL | 1 refills | Status: DC

## 2019-11-09 MED ORDER — ZOLPIDEM TARTRATE 10 MG PO TABS: 5.0000 mg | ORAL_TABLET | Freq: Every evening | ORAL | 0 refills | Status: DC | PRN

## 2019-11-09 NOTE — Progress Notes (Signed)
HPI: Catherine Cantrell is a 54 y.o. female who  has a past medical history of Breast mass, right and Hyperlipidemia.  she presents to [facility] today, 11/09/19,  for chief complaint of: Annual physical exam  Dentist: every 6 months, no concerns Eye exam: within the past year, wears glasses, no concerns Exercise: walks dog 40 minutes daily, Rottweiler Diet: usually making good choices Pap smear: UTD Mammogram: UTD Colon cancer screening: due COVID vaccine: Done  Concerns: Husband in the hospital and she has been in significant stress with increased anxiety. Not sleeping well.   Past medical, surgical, social and family history reviewed:  Patient Active Problem List   Diagnosis Date Noted  . Pap smear of cervix shows high risk HPV present 09/29/2018  . Bacterial vaginitis 09/21/2018  . Insomnia 09/21/2018  . Menopausal symptom 09/21/2018  . Hemorrhoids 05/09/2016  . Gastroesophageal reflux disease 05/09/2016  . Constipation 05/09/2016  . Tobacco dependence 11/12/2015  . Hyperlipidemia 11/24/2013    Past Surgical History:  Procedure Laterality Date  . BREAST LUMPECTOMY WITH RADIOACTIVE SEED LOCALIZATION Right 07/13/2019   Procedure: RIGHT BREAST LUMPECTOMY WITH RADIOACTIVE SEED LOCALIZATION;  Surgeon: Donnie Mesa, MD;  Location: [place];  Service: General;  Laterality: Right;  LMA  . [place]  . KNEE ARTHROSCOPY Bilateral   . KNEE SURGERY Right 1998   ACL repair    Social History   Tobacco Use  . Smoking status: Current Every Day Smoker    Types: E-cigarettes  . Smokeless tobacco: Never Used  Substance Use Topics  . Alcohol use: Yes    Alcohol/week: 4.0 standard drinks    Types: 4 Standard drinks or equivalent per week    Family History  Problem Relation Age of Onset  . Pulmonary Hypertension Mother   . Raynaud syndrome Mother   . Charcot-Marie-Tooth disease Mother   .  Diabetes Father   . Breast cancer Maternal Aunt   . Breast cancer Paternal Grandmother   . Diabetes Maternal Grandmother      Current medication list and allergy/intolerance information reviewed:    Current Outpatient Medications  Medication Sig Dispense Refill  . artificial tears (LACRILUBE) OINT ophthalmic ointment Place into both eyes every 4 (four) hours as needed for dry eyes. 1 Tube 2  . Multiple Vitamin tablet Take 1 tablet by mouth daily.    Marland Kitchen omega-3 acid ethyl esters (LOVAZA) 1 g capsule Take by mouth daily. prn    . Polyethylene Glycol 3350 (MIRALAX PO) Take by mouth.    . nicotine (NICODERM CQ - DOSED IN MG/24 HOURS) 14 mg/24hr patch Place 1 patch (14 mg total) onto the skin daily. 28 patch 1  . zolpidem (AMBIEN) 10 MG tablet Take 0.5 tablets (5 mg total) by mouth at bedtime as needed for sleep. 15 tablet 0   No current facility-administered medications for this visit.    Allergies  Allergen Reactions  . Amoxicillin Hives  . Sulfa Antibiotics Hives  . Codeine Nausea And Vomiting      Review of Systems:  Constitutional:  No  fever, no chills, No recent illness, No unintentional weight changes. + significant fatigue.   HEENT: +  headache, no vision change, no hearing change, No sore throat, No  sinus pressure  Cardiac: No  chest pain, No  pressure, No palpitations, No  Orthopnea  Respiratory:  No  shortness of breath. No  Cough  Gastrointestinal: No  abdominal pain, No  nausea, No  vomiting,  No  blood in stool, No  diarrhea, +  constipation   Musculoskeletal: No new myalgia/arthralgia  Skin: No  Rash, No other wounds/concerning lesions  Genitourinary: No  incontinence, No  abnormal genital bleeding, No abnormal genital discharge  Hem/Onc: No  easy bruising/bleeding, No  abnormal lymph node  Endocrine: No cold intolerance,  No heat intolerance. No polyuria/polydipsia/polyphagia   Neurologic: No  weakness, No  dizziness, No  slurred speech/focal  weakness/facial droop  Psychiatric: No concerns with depression, +  concerns with anxiety, + sleep problems, No mood problems  Exam:  BP 127/76   Pulse 68   Temp 98.1 F (36.7 C) (Oral)   Ht 5' 3"  (1.6 m)   Wt 142 lb 9.6 oz (64.7 kg)   LMP 10/10/2017   SpO2 99%   BMI 25.26 kg/m   Constitutional: VS see above. General Appearance: alert, well-developed, well-nourished, NAD  Eyes: Normal lids and conjunctive, non-icteric sclera  Ears, Nose, Mouth, Throat: MMM, Normal external inspection ears/nares/mouth/lips/gums. TM normal bilaterally. Pharynx/tonsils no erythema, no exudate. Nasal mucosa normal.   Neck: No masses, trachea midline. No thyroid enlargement. No tenderness/mass appreciated. No lymphadenopathy  Respiratory: Normal respiratory effort. no wheeze, no rhonchi, no rales  Cardiovascular: S1/S2 normal, no murmur, no rub/gallop auscultated. RRR. No lower extremity edema. Pedal pulse II/IV bilaterally DP and PT. No carotid bruit or JVD. No abdominal aortic bruit.  Gastrointestinal: Nontender, no masses. No hepatomegaly, no splenomegaly. No hernia appreciated. Bowel sounds normal. Rectal exam deferred.   Musculoskeletal: Gait normal. No clubbing/cyanosis of digits.   Neurological: Normal balance/coordination. No tremor. No cranial nerve deficit on limited exam. Motor and sensation intact and symmetric. Cerebellar reflexes intact.   Skin: warm, dry, intact. No rash/ulcer. No concerning nevi or subq nodules on limited exam.    Psychiatric: Normal judgment/insight. Normal mood and affect. Oriented x3.    No results found for this or any previous visit (from the past 72 hour(s)).  No results found.   ASSESSMENT/PLAN:   1. Annual physical exam Checking CBC, CMP, and lipid panel today. Up-to-date on mammogram and Pap smear. - CBC - Lipid panel - CMP and Liver  2. Screening for colon cancer Discussed colon cancer screening options. Patient would like to do Cologuard.  Cologuard ordered, form signed and provided to MA for faxing. - Cologuard  3. History of vitamin D deficiency Checking vitamin D level today. - Vitamin D 1,25 dihydroxy  4. History of vitamin B deficiency Checking vitamin B12 levels today. - Vitamin B12  5. Insomnia due to other mental disorder She has tried several things historically that were not helpful for insomnia but notes that Ambien works well for her. I feel her anxiety is in part due to her situation but in part due to poor sleep. We will do a short course of Ambien 5 mg nightly as needed for sleep. Advised patient to discuss longer-term treatment with this if she feels it necessary with her PCP.  Orders Placed This Encounter  Procedures  . Cologuard  . CBC  . Lipid panel  . CMP and Liver  . Vitamin B12  . Vitamin D 1,25 dihydroxy    Meds ordered this encounter  Medications  . zolpidem (AMBIEN) 10 MG tablet    Sig: Take 0.5 tablets (5 mg total) by mouth at bedtime as needed for sleep.    Dispense:  15 tablet    Refill:  0    Order Specific Question:  Supervising Provider    Answer:   Emeterio Reeve [[id number]]  . nicotine (NICODERM CQ - DOSED IN MG/24 HOURS) 14 mg/24hr patch    Sig: Place 1 patch (14 mg total) onto the skin daily.    Dispense:  28 patch    Refill:  1    Order Specific Question:   Supervising Provider    Answer:   Emeterio Reeve [phone]    Patient Instructions  Preventive Care 3-19 Years Old, Female Preventive care refers to visits with your health care provider and lifestyle choices that can promote health and wellness. This includes:  A yearly physical exam. This may also be called an annual well check.  Regular dental visits and eye exams.  Immunizations.  Screening for certain conditions.  Healthy lifestyle choices, such as eating a healthy diet, getting regular exercise, not using drugs or products that contain nicotine and tobacco, and limiting alcohol use. What can I  expect for my preventive care visit? Physical exam Your health care provider will check your:  Height and weight. This may be used to calculate body mass index (BMI), which tells if you are at a healthy weight.  Heart rate and blood pressure.  Skin for abnormal spots. Counseling Your health care provider may ask you questions about your:  Alcohol, tobacco, and drug use.  Emotional well-being.  Home and relationship well-being.  Sexual activity.  Eating habits.  Work and work Statistician.  Method of birth control.  Menstrual cycle.  Pregnancy history. What immunizations do I need?  Influenza (flu) vaccine  This is recommended every year. Tetanus, diphtheria, and pertussis (Tdap) vaccine  You may need a Td booster every 10 years. Varicella (chickenpox) vaccine  You may need this if you have not been vaccinated. Zoster (shingles) vaccine  You may need this after age 66. Measles, mumps, and rubella (MMR) vaccine  You may need at least one dose of MMR if you were born in 1957 or later. You may also need a second dose. Pneumococcal conjugate (PCV13) vaccine  You may need this if you have certain conditions and were not previously vaccinated. Pneumococcal polysaccharide (PPSV23) vaccine  You may need one or two doses if you smoke cigarettes or if you have certain conditions. Meningococcal conjugate (MenACWY) vaccine  You may need this if you have certain conditions. Hepatitis A vaccine  You may need this if you have certain conditions or if you travel or work in places where you may be exposed to hepatitis A. Hepatitis B vaccine  You may need this if you have certain conditions or if you travel or work in places where you may be exposed to hepatitis B. Haemophilus influenzae type b (Hib) vaccine  You may need this if you have certain conditions. Human papillomavirus (HPV) vaccine  If recommended by your health care provider, you may need three doses over 6  months. You may receive vaccines as individual doses or as more than one vaccine together in one shot (combination vaccines). Talk with your health care provider about the risks and benefits of combination vaccines. What tests do I need? Blood tests  Lipid and cholesterol levels. These may be checked every 5 years, or more frequently if you are over 81 years old.  Hepatitis C test.  Hepatitis B test. Screening  Lung cancer screening. You may have this screening every year starting at age 60 if you have a 30-pack-year history of smoking and currently smoke or have quit within the past 15 years.  Colorectal cancer screening. All adults should have this screening starting at age 43 and continuing until age 39. Your health care provider may recommend screening at age 54 if you are at increased risk. You will have tests every 1-10 years, depending on your results and the type of screening test.  Diabetes screening. This is done by checking your blood sugar (glucose) after you have not eaten for a while (fasting). You may have this done every 1-3 years.  Mammogram. This may be done every 1-2 years. Talk with your health care provider about when you should start having regular mammograms. This may depend on whether you have a family history of breast cancer.  BRCA-related cancer screening. This may be done if you have a family history of breast, ovarian, tubal, or peritoneal cancers.  Pelvic exam and Pap test. This may be done every 3 years starting at age 80. Starting at age 32, this may be done every 5 years if you have a Pap test in combination with an HPV test. Other tests  Sexually transmitted disease (STD) testing.  Bone density scan. This is done to screen for osteoporosis. You may have this scan if you are at high risk for osteoporosis. Follow these instructions at home: Eating and drinking  Eat a diet that includes fresh fruits and vegetables, whole grains, lean protein, and low-fat  dairy.  Take vitamin and mineral supplements as recommended by your health care provider.  Do not drink alcohol if: ? Your health care provider tells you not to drink. ? You are pregnant, may be pregnant, or are planning to become pregnant.  If you drink alcohol: ? Limit how much you have to 0-1 drink a day. ? Be aware of how much alcohol is in your drink. In the [place] one drink equals one 12 oz bottle of beer (355 mL), one 5 oz glass of wine (148 mL), or one 1 oz glass of hard liquor (44 mL). Lifestyle  Take daily care of your teeth and gums.  Stay active. Exercise for at least 30 minutes on 5 or more days each week.  Do not use any products that contain nicotine or tobacco, such as cigarettes, e-cigarettes, and chewing tobacco. If you need help quitting, ask your health care provider.  If you are sexually active, practice safe sex. Use a condom or other form of birth control (contraception) in order to prevent pregnancy and STIs (sexually transmitted infections).  If told by your health care provider, take low-dose aspirin daily starting at age 21. What's next?  Visit your health care provider once a year for a well check visit.  Ask your health care provider how often you should have your eyes and teeth checked.  Stay up to date on all vaccines. This information is not intended to replace advice given to you by your health care provider. Make sure you discuss any questions you have with your health care provider. Document Revised: 09/18/2017 Document Reviewed: 09/18/2017 Elsevier Patient Education  [employer].  Follow-up plan: Return in about 1 year (around 11/08/2020) for annual physical exam or sooner if needed.  Clearnce Sorrel, DNP, APRN, FNP-BC [facility]

## 2019-11-09 NOTE — Patient Instructions (Signed)

## 2020-01-17 ENCOUNTER — Telehealth: Payer: Self-pay

## 2020-01-17 NOTE — Telephone Encounter (Addendum)
Fax received from [place] that does the [employer] testing, stating they have tried to reach out to the pt to encourage her to complete the Cologuard testing but have not been able to reach her. I called and LVM for pt encouraging her to do the Cologuard testing, and gave her the phone number to their office in case she needed to get a new testing kit. Our phone number provided in case she has further questions or concerns for [place].   Probation officer [phone] phone

## 2020-01-19 ENCOUNTER — Other Ambulatory Visit: Payer: Self-pay | Admitting: Medical-Surgical

## 2020-02-04 ENCOUNTER — Other Ambulatory Visit: Payer: Self-pay | Admitting: Medical-Surgical

## 2020-02-04 NOTE — Telephone Encounter (Signed)
Last written 11/09/2019 #15 no refills  Last appt 11/09/2019

## 2020-02-16 LAB — COLOGUARD
COLOGUARD: NEGATIVE
Cologuard: NEGATIVE

## 2020-02-28 ENCOUNTER — Telehealth: Payer: Self-pay

## 2020-02-28 NOTE — Telephone Encounter (Signed)
If nothing in chart re: results (and I don't see anything in labs or in health maintenance tab either) can call  [facility] [phone] phone

## 2020-02-28 NOTE — Telephone Encounter (Signed)
Patient is calling for her Cologuard results.

## 2020-02-29 NOTE — Telephone Encounter (Signed)
Scanned into chart

## 2020-02-29 NOTE — Telephone Encounter (Signed)
Faythe Ghee, was patient called to inform her of results?  Thanks!

## 2020-03-02 NOTE — Telephone Encounter (Signed)
Task completed. Left a detailed vm msg for pt regarding Cologuard results. Direct call back info provided.

## 2020-07-31 ENCOUNTER — Encounter (HOSPITAL_COMMUNITY): Payer: Self-pay

## 2020-08-14 ENCOUNTER — Telehealth: Payer: Self-pay

## 2020-08-14 NOTE — Telephone Encounter (Signed)
Transition Care Management Unsuccessful Follow-up Telephone Call  Date of discharge and from where:  08/11/2020 from [facility]  Attempts:  1st Attempt  Reason for unsuccessful TCM follow-up call:  Left voice message

## 2020-08-15 NOTE — Telephone Encounter (Signed)
Transition Care Management Unsuccessful Follow-up Telephone Call  Date of discharge and from where:  08/11/2020 from [facility]  Attempts:  2nd Attempt  Reason for unsuccessful TCM follow-up call:  Left voice message

## 2020-08-16 NOTE — Telephone Encounter (Signed)
Transition Care Management Unsuccessful Follow-up Telephone Call  Date of discharge and from where:  08/11/20 from [facility]  Attempts:  3rd Attempt  Reason for unsuccessful TCM follow-up call:  Left voice message

## 2020-09-12 ENCOUNTER — Other Ambulatory Visit: Payer: Self-pay | Admitting: Obstetrics & Gynecology

## 2020-09-12 DIAGNOSIS — Z1231 Encounter for screening mammogram for malignant neoplasm of breast: Secondary | ICD-10-CM

## 2020-10-04 NOTE — Progress Notes (Signed)
GYNECOLOGY ANNUAL PREVENTATIVE CARE ENCOUNTER NOTE  History:     Catherine Cantrell is a 55 y.o. [phone] female here for a routine annual gynecologic exam.  Current complaints: her main concern today is her poor sleep. It is due both to menopause and stress and she feels like it is a perpetuating cycle. She is sleepy through the day due to frequent awakenings. She doesn't have the energy to exercise although she knows this would help both stress and sleep. She feels like she is making poor dietary choices due to exhaustion and therefore she has gained weight. She has done [place] in the past and would do it again just to get sleep but hated how she felt when she woke up. She has tried many OTC - unisom, ashwaganda, magnesium, melatonin. She has not tried valerian root.   Denies abnormal vaginal bleeding, discharge, pelvic pain, problems with intercourse or other gynecologic concerns. She is not sexually active due to husband having prostate surgery. She has some irritation on occasion on the outside but feels like it is due to the change in [facility] from hormonal changes.   She had been on HRT but came off it when she needed breast biopsy last year (not cancer). She has mammogram today.   Gynecologic History Patient's last menstrual period was 10/10/2017.  Last Pap: 2019. Result was normal with positive HPV. 2020 - pap was normal, but HPV pos. Colposcopy recommended but none done bc pt wanted 6 month follow up. She did not return in 6 months. She was reminded of pap in 04/2019 but did not schedule. Ultimately, she was unable to do so because her husband was sick in the hospital for 6 months. He is doing better now.  Last Mammogram: 10/05/20.  Result is pending  Last Colonoscopy: 2017.    Obstetric History OB History  Gravida Para Term Preterm AB Living  '3 2     1 2  '$ SAB IAB Ectopic Multiple Live Births    1          # Outcome Date GA Lbr Len/2nd Weight Sex Delivery Anes PTL Lv  3 Para            2 Para           1 IAB             Past Medical History:  Diagnosis Date   Breast mass, right    Hyperlipidemia     Past Surgical History:  Procedure Laterality Date   BREAST LUMPECTOMY WITH RADIOACTIVE SEED LOCALIZATION Right 07/13/2019   Procedure: RIGHT BREAST LUMPECTOMY WITH RADIOACTIVE SEED LOCALIZATION;  Surgeon: Donnie Mesa, MD;  Location: [facility];  Service: General;  Laterality: Right;  [place] ARTHROSCOPY Bilateral    KNEE SURGERY Right 1998   ACL repair    Current Outpatient Medications on File Prior to Visit  Medication Sig Dispense Refill   artificial tears (LACRILUBE) OINT ophthalmic ointment Place into both eyes every 4 (four) hours as needed for dry eyes. 1 Tube 2   Multiple Vitamin tablet Take 1 tablet by mouth daily.     nicotine (NICODERM CQ - DOSED IN MG/24 HOURS) 14 mg/24hr patch Place 1 patch (14 mg total) onto the skin daily. 28 patch 1   omega-3 acid ethyl esters (LOVAZA) 1 g capsule Take by mouth daily. prn     Polyethylene Glycol 3350 (MIRALAX PO) Take by mouth.  zolpidem (AMBIEN) 10 MG tablet Take 0.5 tablets (5 mg total) by mouth at bedtime as needed for sleep. Please discuss further refills with PCP. 15 tablet 0   No current facility-administered medications on file prior to visit.    Allergies  Allergen Reactions   Amoxicillin Hives   Sulfa Antibiotics Hives   Codeine Nausea And Vomiting    Social History:  reports that she has been smoking e-cigarettes. She has never used smokeless tobacco. She reports current alcohol use of about 4.0 standard drinks per week. She reports that she does not use drugs.  Family History  Problem Relation Age of Onset   Pulmonary Hypertension Mother    Raynaud syndrome Mother    Charcot-Marie-Tooth disease Mother    Diabetes Father    Breast cancer Maternal Aunt    Breast cancer Paternal Grandmother    Diabetes Maternal Grandmother     The  following portions of the patient's history were reviewed and updated as appropriate: allergies, current medications, past family history, past medical history, past social history, past surgical history and problem list.  Review of Systems Pertinent items noted in HPI and remainder of comprehensive ROS otherwise negative.  Physical Exam:  LMP 10/10/2017  CONSTITUTIONAL: Well-developed, well-nourished female in no acute distress.  HENT:  Normocephalic, atraumatic, External right and left ear normal.  EYES: Conjunctivae and EOM are normal. Pupils are equal, round, and reactive to light. No scleral icterus.  NECK: Normal range of motion, supple, no masses.  Normal thyroid.  SKIN: Skin is warm and dry. No rash noted. Not diaphoretic. No erythema. No pallor. MUSCULOSKELETAL: Normal range of motion. No tenderness.  No cyanosis, clubbing, or edema. NEUROLOGIC: Alert and oriented to person, place, and time. Normal reflexes, muscle tone coordination.  PSYCHIATRIC: Normal mood and affect. Normal behavior. Normal judgment and thought content.  CARDIOVASCULAR: Normal heart rate noted, regular rhythm RESPIRATORY: Clear to auscultation bilaterally. Effort and breath sounds normal, no problems with respiration noted.  BREASTS: Symmetric in size. No masses, tenderness, skin changes, nipple drainage, or lymphadenopathy bilaterally. Performed in the presence of a chaperone. ABDOMEN: Soft, no distention noted.  No tenderness, rebound or guarding.  PELVIC: External genitalia normal but has darker pigmentation on the labia minora and some loss of pigmentation on the left labia majora - this is baseline for her since she was in her 20s, Vagina normal without discharge, Urethra without abnormality or discharge, no bladder tenderness, cervix normal in appearance, no CMT, uterus normal size, shape, and consistency, no adnexal masses or tenderness. Performed in the presence of a chaperone.    Assessment and Plan:     1. Encounter for annual routine gynecological examination - Cervical cancer screening: Discussed guidelines. Pap with HPV done  - Breast Health: Encouraged self breast awareness/SBE. Discussed limits of clinical breast exam for detecting breast cancer. Discussed importance of annual MXR.  Rx given - Climacteric/Sexual health: Reviewed typical and atypical symptoms of menopause/peri-menopause. Discussed PMB and to call if any amount of spotting.  - Bone Health: Calcium via diet and supplementation. Discussed weight bearing exercise. DEXA due  by age 42.  - Colonoscopy: up to date - F/U 12 months and prn   2. Encounter for screening for malignant neoplasm of breast, unspecified screening modality - Mammogram being done today  Menopausal symptom - We discussed the treatment options for menopause - discussing both HRT and non-HRT.  - Discussed the benefits of each and effectiveness - Discussed goals of therapy i.e. reduction of hot  flashes (not complete resolution) and help with sleep - Discussed if HRT we do shortest amount of time at lowest dose. We discussed annual attempts at coming of the HRT typically in the fall months when the weather has cooled down. - Discussed risks of HRT: E+P = breast cancer, clotting, MI/Stroke - Discussed genitourinary symptoms i.e. urinary issues, vaginal dryness and dyspareunia and that for these symptoms, local treatment is best. - She would like: non-HRT options of Gabapentin   Insomnia, unspecified type - Per above  Pap smear of cervix shows high risk HPV present - Pap with HPV done today   Routine preventative health maintenance measures emphasized. Please refer to After Visit Summary for other counseling recommendations.      Radene Gunning, MD, [place] for [facility], [place]

## 2020-10-05 ENCOUNTER — Ambulatory Visit (INDEPENDENT_AMBULATORY_CARE_PROVIDER_SITE_OTHER): Payer: Managed Care, Other (non HMO)

## 2020-10-05 ENCOUNTER — Other Ambulatory Visit: Payer: Self-pay

## 2020-10-05 ENCOUNTER — Other Ambulatory Visit (HOSPITAL_COMMUNITY)
Admission: RE | Admit: 2020-10-05 | Discharge: 2020-10-05 | Disposition: A | Discharge status: Home / Self Care | Payer: Managed Care, Other (non HMO) | Source: Ambulatory Visit | Attending: Obstetrics and Gynecology | Admitting: Obstetrics and Gynecology

## 2020-10-05 ENCOUNTER — Encounter: Payer: Managed Care, Other (non HMO) | Admitting: Medical-Surgical

## 2020-10-05 ENCOUNTER — Encounter: Payer: Self-pay | Admitting: Obstetrics and Gynecology

## 2020-10-05 ENCOUNTER — Ambulatory Visit (INDEPENDENT_AMBULATORY_CARE_PROVIDER_SITE_OTHER): Payer: Managed Care, Other (non HMO) | Admitting: Obstetrics and Gynecology

## 2020-10-05 VITALS — BP 130/57 | HR 78 | Resp 16 | Ht 62.5 in | Wt 159.0 lb

## 2020-10-05 DIAGNOSIS — G4700 Insomnia, unspecified: Secondary | ICD-10-CM

## 2020-10-05 DIAGNOSIS — Z1231 Encounter for screening mammogram for malignant neoplasm of breast: Secondary | ICD-10-CM

## 2020-10-05 DIAGNOSIS — Z01419 Encounter for gynecological examination (general) (routine) without abnormal findings: Secondary | ICD-10-CM | POA: Diagnosis present

## 2020-10-05 DIAGNOSIS — N951 Menopausal and female climacteric states: Secondary | ICD-10-CM

## 2020-10-05 DIAGNOSIS — R87810 Cervical high risk human papillomavirus (HPV) DNA test positive: Secondary | ICD-10-CM

## 2020-10-05 DIAGNOSIS — Z1239 Encounter for other screening for malignant neoplasm of breast: Secondary | ICD-10-CM | POA: Diagnosis not present

## 2020-10-05 DIAGNOSIS — F419 Anxiety disorder, unspecified: Secondary | ICD-10-CM | POA: Insufficient documentation

## 2020-10-05 MED ORDER — GABAPENTIN 300 MG PO CAPS: 300.0000 mg | ORAL_CAPSULE | Freq: Every day | ORAL | 3 refills | Status: DC

## 2020-10-05 NOTE — Progress Notes (Signed)
Late arrival/no-show.

## 2020-10-09 LAB — CYTOLOGY - PAP
Comment: NEGATIVE
Diagnosis: NEGATIVE
High risk HPV: NEGATIVE

## 2020-10-10 ENCOUNTER — Telehealth: Payer: Self-pay

## 2020-10-10 NOTE — Telephone Encounter (Addendum)
Spoke with pt and she is aware of negative pap smear and to repeat in a year. Pt expressed understanding.   ----- Message from Radene Gunning, MD sent at 10/10/2020  1:43 PM EDT ----- She doesn't have mychart - maybe resend to her the link.   Also her pap and HPV is normal/negative. I would recheck again in one year given her pap history.  Thanks, pad

## 2020-11-10 ENCOUNTER — Ambulatory Visit (INDEPENDENT_AMBULATORY_CARE_PROVIDER_SITE_OTHER): Payer: Managed Care, Other (non HMO) | Admitting: Medical-Surgical

## 2020-11-10 ENCOUNTER — Encounter: Payer: Self-pay | Admitting: Medical-Surgical

## 2020-11-10 ENCOUNTER — Other Ambulatory Visit: Payer: Self-pay

## 2020-11-10 VITALS — BP 127/80 | HR 66 | Temp 98.2°F | Wt 159.0 lb

## 2020-11-10 DIAGNOSIS — Z7689 Persons encountering health services in other specified circumstances: Secondary | ICD-10-CM | POA: Diagnosis not present

## 2020-11-10 DIAGNOSIS — Z0000 Encounter for general adult medical examination without abnormal findings: Secondary | ICD-10-CM | POA: Diagnosis not present

## 2020-11-10 MED ORDER — HYDROXYZINE HCL 25 MG PO TABS: 25.0000 mg | ORAL_TABLET | Freq: Three times a day (TID) | ORAL | 1 refills | Status: DC | PRN

## 2020-11-10 NOTE — Progress Notes (Signed)
HPI: Catherine Cantrell is a 54 y.o. female who  has a past medical history of Breast mass, right and Hyperlipidemia.  she presents to [facility] today, 11/10/20,  for chief complaint of: Annual physical exam Transfer of care  Dentist: UTD Eye exam: UTD, glasses Exercise: none intentional Diet: no restrictions Pap smear: sees OBGYN Mammogram: done 09/2020 Colon cancer screening: Cologuard done, UTD COVID vaccine: done  Concerns: None  Past medical, surgical, social and family history reviewed:  Patient Active Problem List   Diagnosis Date Noted   Anxiety 10/05/2020   Pap smear of cervix shows high risk HPV present 09/29/2018   Insomnia 09/21/2018   Menopausal symptom 09/21/2018   Hemorrhoids 05/09/2016   Gastroesophageal reflux disease 05/09/2016   Constipation 05/09/2016   Tobacco dependence 11/12/2015   Hyperlipidemia 11/24/2013    Past Surgical History:  Procedure Laterality Date   BIOPSY BREAST Right 05/04/2019   [facility]   BREAST EXCISIONAL BIOPSY Right 07/13/2019   [facility]   BREAST LUMPECTOMY WITH RADIOACTIVE SEED LOCALIZATION Right 07/13/2019   Procedure: RIGHT BREAST LUMPECTOMY WITH RADIOACTIVE SEED LOCALIZATION;  Surgeon: Donnie Mesa, MD;  Location: [facility];  Service: General;  Laterality: Right;  [place] ARTHROSCOPY Bilateral    KNEE SURGERY Right 1998   ACL repair    Social History   Tobacco Use   Smoking status: Every Day    Types: E-cigarettes   Smokeless tobacco: Never  Substance Use Topics   Alcohol use: Yes    Alcohol/week: 4.0 standard drinks    Types: 4 Standard drinks or equivalent per week    Family History  Problem Relation Age of Onset   Pulmonary Hypertension Mother    Raynaud syndrome Mother    Charcot-Marie-Tooth disease Mother    Diabetes Father    Breast cancer Maternal Aunt    Breast cancer Paternal Grandmother    Diabetes Maternal  Grandmother      Current medication list and allergy/intolerance information reviewed:    Current Outpatient Medications  Medication Sig Dispense Refill   artificial tears (LACRILUBE) OINT ophthalmic ointment Place into both eyes every 4 (four) hours as needed for dry eyes. 1 Tube 2   gabapentin (NEURONTIN) 300 MG capsule Take 1 capsule (300 mg total) by mouth at bedtime. 90 capsule 3   hydrOXYzine (ATARAX/VISTARIL) 25 MG tablet Take 1 tablet (25 mg total) by mouth every 8 (eight) hours as needed. 30 tablet 1   Multiple Vitamin tablet Take 1 tablet by mouth daily. (Patient not taking: Reported on 11/10/2020)     zolpidem (AMBIEN) 10 MG tablet Take 0.5 tablets (5 mg total) by mouth at bedtime as needed for sleep. Please discuss further refills with PCP. 15 tablet 0   No current facility-administered medications for this visit.    Allergies  Allergen Reactions   Amoxicillin Hives   Sulfa Antibiotics Hives   Codeine Nausea And Vomiting      Review of Systems: Constitutional:  No  fever, no chills, No recent illness, No unintentional weight changes. No significant fatigue.  HEENT: No  headache, no vision change, no hearing change, No sore throat, No  sinus pressure Cardiac: No  chest pain, No  pressure, No palpitations, No  Orthopnea Respiratory:  No  shortness of breath. No  Cough Gastrointestinal: No  abdominal pain, No  nausea, No  vomiting,  No  blood in stool, No  diarrhea, No  constipation  Musculoskeletal: No new myalgia/arthralgia Skin: No  Rash, No other wounds/concerning lesions Genitourinary: No  incontinence, No  abnormal genital bleeding, No abnormal genital discharge Hem/Onc: No  easy bruising/bleeding, No  abnormal lymph node Endocrine: No cold intolerance,  No heat intolerance. No polyuria/polydipsia/polyphagia  Neurologic: No  weakness, No  dizziness, No  slurred speech/focal weakness/facial droop Psychiatric: No  concerns with depression, No  concerns with anxiety,  No sleep problems, No mood problems  Exam:  BP 127/80 (BP Location: Left Arm, Patient Position: Sitting, Cuff Size: Normal)   Pulse 66   Temp 98.2 F (36.8 C) (Oral)   Wt 159 lb 0.6 oz (72.1 kg)   LMP 10/10/2017   SpO2 100%   BMI 28.63 kg/m  Constitutional: VS see above. General Appearance: alert, well-developed, well-nourished, NAD Eyes: Normal lids and conjunctive, non-icteric sclera Ears, Nose, Mouth, Throat: MMM, Normal external inspection ears/nares/mouth/lips/gums. TM normal bilaterally.   Neck: No masses, trachea midline. No thyroid enlargement. No tenderness/mass appreciated. No lymphadenopathy Respiratory: Normal respiratory effort. no wheeze, no rhonchi, no rales Cardiovascular: S1/S2 normal, no murmur, no rub/gallop auscultated. RRR. No lower extremity edema. Pedal pulse II/IV bilaterally PT. No carotid bruit or JVD. No abdominal aortic bruit. Gastrointestinal: Nontender, no masses. No hepatomegaly, no splenomegaly. No hernia appreciated. Bowel sounds normal. Rectal exam deferred.  Musculoskeletal: Gait normal. No clubbing/cyanosis of digits.  Neurological: Normal balance/coordination. No tremor. No cranial nerve deficit on limited exam. Motor and sensation intact and symmetric. Cerebellar reflexes intact.  Skin: warm, dry, intact. No rash/ulcer. No concerning nevi or subq nodules on limited exam.   Psychiatric: Normal judgment/insight. Normal mood and affect. Oriented x3.    ASSESSMENT/PLAN:   1. Encounter to establish care Reviewed available information and discussed care concerns with patient.   2. Annual physical exam Had labs 3 months ago with [facility] with only concern being elevated cholesterol. Wants to make changes to her diet and exercise habits before having them rechecked. Lab orders entered and she will come back to have them done at a later time.   Orders Placed This Encounter  Procedures   Lipid panel   COMPLETE METABOLIC PANEL WITH GFR   CBC with  Differential/Platelet     Meds ordered this encounter  Medications   hydrOXYzine (ATARAX/VISTARIL) 25 MG tablet    Sig: Take 1 tablet (25 mg total) by mouth every 8 (eight) hours as needed.    Dispense:  30 tablet    Refill:  1     Patient Instructions  Preventive Care 6-19 Years Old, Female Preventive care refers to lifestyle choices and visits with your health care provider that can promote health and wellness. This includes: A yearly physical exam. This is also called an annual wellness visit. Regular dental and eye exams. Immunizations. Screening for certain conditions. Healthy lifestyle choices, such as: Eating a healthy diet. Getting regular exercise. Not using drugs or products that contain nicotine and tobacco. Limiting alcohol use. What can I expect for my preventive care visit? Physical exam Your health care provider will check your: Height and weight. These may be used to calculate your BMI (body mass index). BMI is a measurement that tells if you are at a healthy weight. Heart rate and blood pressure. Body temperature. Skin for abnormal spots. Counseling Your health care provider may ask you questions about your: Past medical problems. Family's medical history. Alcohol, tobacco, and drug use. Emotional well-being. Home life and relationship well-being. Sexual activity. Diet, exercise, and sleep habits. Work  and work environment. Access to firearms. Method of birth control. Menstrual cycle. Pregnancy history. What immunizations do I need? Vaccines are usually given at various ages, according to a schedule. Your health care provider will recommend vaccines for you based on your age, medical history, and lifestyle or other factors, such as travel or where you work. What tests do I need? Blood tests Lipid and cholesterol levels. These may be checked every 5 years, or more often if you are over 5 years old. Hepatitis C test. Hepatitis B  test. Screening Lung cancer screening. You may have this screening every year starting at age 50 if you have a 30-pack-year history of smoking and currently smoke or have quit within the past 15 years. Colorectal cancer screening. All adults should have this screening starting at age 52 and continuing until age 92. Your health care provider may recommend screening at age 73 if you are at increased risk. You will have tests every 1-10 years, depending on your results and the type of screening test. Diabetes screening. This is done by checking your blood sugar (glucose) after you have not eaten for a while (fasting). You may have this done every 1-3 years. Mammogram. This may be done every 1-2 years. Talk with your health care provider about when you should start having regular mammograms. This may depend on whether you have a family history of breast cancer. BRCA-related cancer screening. This may be done if you have a family history of breast, ovarian, tubal, or peritoneal cancers. Pelvic exam and Pap test. This may be done every 3 years starting at age 41. Starting at age 36, this may be done every 5 years if you have a Pap test in combination with an HPV test. Other tests STD (sexually transmitted disease) testing, if you are at risk. Bone density scan. This is done to screen for osteoporosis. You may have this scan if you are at high risk for osteoporosis. Talk with your health care provider about your test results, treatment options, and if necessary, the need for more tests. Follow these instructions at home: Eating and drinking  Eat a diet that includes fresh fruits and vegetables, whole grains, lean protein, and low-fat dairy products. Take vitamin and mineral supplements as recommended by your health care provider. Do not drink alcohol if: Your health care provider tells you not to drink. You are pregnant, may be pregnant, or are planning to become pregnant. If you drink  alcohol: Limit how much you have to 0-1 drink a day. Be aware of how much alcohol is in your drink. In the [place] one drink equals one 12 oz bottle of beer (355 mL), one 5 oz glass of wine (148 mL), or one 1 oz glass of hard liquor (44 mL). Lifestyle Take daily care of your teeth and gums. Brush your teeth every morning and night with fluoride toothpaste. Floss one time each day. Stay active. Exercise for at least 30 minutes 5 or more days each week. Do not use any products that contain nicotine or tobacco, such as cigarettes, e-cigarettes, and chewing tobacco. If you need help quitting, ask your health care provider. Do not use drugs. If you are sexually active, practice safe sex. Use a condom or other form of protection to prevent STIs (sexually transmitted infections). If you do not wish to become pregnant, use a form of birth control. If you plan to become pregnant, see your health care provider for a prepregnancy visit. If told by your health  care provider, take low-dose aspirin daily starting at age 57. Find healthy ways to cope with stress, such as: Meditation, yoga, or listening to music. Journaling. Talking to a trusted person. Spending time with friends and family. Safety Always wear your seat belt while driving or riding in a vehicle. Do not drive: If you have been drinking alcohol. Do not ride with someone who has been drinking. When you are tired or distracted. While texting. Wear a helmet and other protective equipment during sports activities. If you have firearms in your house, make sure you follow all gun safety procedures. What's next? Visit your health care provider once a year for an annual wellness visit. Ask your health care provider how often you should have your eyes and teeth checked. Stay up to date on all vaccines. This information is not intended to replace advice given to you by your health care provider. Make sure you discuss any questions you have with your  health care provider. Document Revised: 03/17/2020 Document Reviewed: 09/18/2017 Elsevier Patient Education  2022 [employer].  Follow-up plan: No follow-ups on file.  Clearnce Sorrel, DNP, APRN, FNP-BC [facility]

## 2020-11-10 NOTE — Patient Instructions (Signed)
Preventive Care 40-55 Years Old, Female Preventive care refers to lifestyle choices and visits with your health care provider that can promote health and wellness. This includes: A yearly physical exam. This is also called an annual wellness visit. Regular dental and eye exams. Immunizations. Screening for certain conditions. Healthy lifestyle choices, such as: Eating a healthy diet. Getting regular exercise. Not using drugs or products that contain nicotine and tobacco. Limiting alcohol use. What can I expect for my preventive care visit? Physical exam Your health care provider will check your: Height and weight. These may be used to calculate your BMI (body mass index). BMI is a measurement that tells if you are at a healthy weight. Heart rate and blood pressure. Body temperature. Skin for abnormal spots. Counseling Your health care provider may ask you questions about your: Past medical problems. Family's medical history. Alcohol, tobacco, and drug use. Emotional well-being. Home life and relationship well-being. Sexual activity. Diet, exercise, and sleep habits. Work and work environment. Access to firearms. Method of birth control. Menstrual cycle. Pregnancy history. What immunizations do I need? Vaccines are usually given at various ages, according to a schedule. Your health care provider will recommend vaccines for you based on your age, medical history, and lifestyle or other factors, such as travel or where you work. What tests do I need? Blood tests Lipid and cholesterol levels. These may be checked every 5 years, or more often if you are over 50 years old. Hepatitis C test. Hepatitis B test. Screening Lung cancer screening. You may have this screening every year starting at age 55 if you have a 30-pack-year history of smoking and currently smoke or have quit within the past 15 years. Colorectal cancer screening. All adults should have this screening starting at  age 50 and continuing until age 75. Your health care provider may recommend screening at age 45 if you are at increased risk. You will have tests every 1-10 years, depending on your results and the type of screening test. Diabetes screening. This is done by checking your blood sugar (glucose) after you have not eaten for a while (fasting). You may have this done every 1-3 years. Mammogram. This may be done every 1-2 years. Talk with your health care provider about when you should start having regular mammograms. This may depend on whether you have a family history of breast cancer. BRCA-related cancer screening. This may be done if you have a family history of breast, ovarian, tubal, or peritoneal cancers. Pelvic exam and Pap test. This may be done every 3 years starting at age 21. Starting at age 30, this may be done every 5 years if you have a Pap test in combination with an HPV test. Other tests STD (sexually transmitted disease) testing, if you are at risk. Bone density scan. This is done to screen for osteoporosis. You may have this scan if you are at high risk for osteoporosis. Talk with your health care provider about your test results, treatment options, and if necessary, the need for more tests. Follow these instructions at home: Eating and drinking  Eat a diet that includes fresh fruits and vegetables, whole grains, lean protein, and low-fat dairy products. Take vitamin and mineral supplements as recommended by your health care provider. Do not drink alcohol if: Your health care provider tells you not to drink. You are pregnant, may be pregnant, or are planning to become pregnant. If you drink alcohol: Limit how much you have to 0-1 drink a day. Be   aware of how much alcohol is in your drink. In the [place] one drink equals one 12 oz bottle of beer (355 mL), one 5 oz glass of wine (148 mL), or one 1 oz glass of hard liquor (44 mL). Lifestyle Take daily care of your teeth and  gums. Brush your teeth every morning and night with fluoride toothpaste. Floss one time each day. Stay active. Exercise for at least 30 minutes 5 or more days each week. Do not use any products that contain nicotine or tobacco, such as cigarettes, e-cigarettes, and chewing tobacco. If you need help quitting, ask your health care provider. Do not use drugs. If you are sexually active, practice safe sex. Use a condom or other form of protection to prevent STIs (sexually transmitted infections). If you do not wish to become pregnant, use a form of birth control. If you plan to become pregnant, see your health care provider for a prepregnancy visit. If told by your health care provider, take low-dose aspirin daily starting at age 63. Find healthy ways to cope with stress, such as: Meditation, yoga, or listening to music. Journaling. Talking to a trusted person. Spending time with friends and family. Safety Always wear your seat belt while driving or riding in a vehicle. Do not drive: If you have been drinking alcohol. Do not ride with someone who has been drinking. When you are tired or distracted. While texting. Wear a helmet and other protective equipment during sports activities. If you have firearms in your house, make sure you follow all gun safety procedures. What's next? Visit your health care provider once a year for an annual wellness visit. Ask your health care provider how often you should have your eyes and teeth checked. Stay up to date on all vaccines. This information is not intended to replace advice given to you by your health care provider. Make sure you discuss any questions you have with your health care provider. Document Revised: 03/17/2020 Document Reviewed: 09/18/2017 Elsevier Patient Education  2022 [employer].

## 2021-02-27 ENCOUNTER — Ambulatory Visit: Payer: Managed Care, Other (non HMO) | Admitting: Medical-Surgical

## 2021-02-27 ENCOUNTER — Encounter: Payer: Self-pay | Admitting: Medical-Surgical

## 2021-02-27 ENCOUNTER — Other Ambulatory Visit: Payer: Self-pay

## 2021-02-27 VITALS — BP 140/78 | HR 73 | Resp 20 | Ht 62.5 in | Wt 159.7 lb

## 2021-02-27 DIAGNOSIS — F99 Mental disorder, not otherwise specified: Secondary | ICD-10-CM

## 2021-02-27 DIAGNOSIS — F5105 Insomnia due to other mental disorder: Secondary | ICD-10-CM

## 2021-02-27 DIAGNOSIS — R1013 Epigastric pain: Secondary | ICD-10-CM | POA: Diagnosis not present

## 2021-02-27 MED ORDER — HYDROXYZINE HCL 25 MG PO TABS: 25.0000 mg | ORAL_TABLET | Freq: Three times a day (TID) | ORAL | 1 refills | Status: DC | PRN

## 2021-02-27 MED ORDER — ZOLPIDEM TARTRATE 10 MG PO TABS: 5.0000 mg | ORAL_TABLET | Freq: Every evening | ORAL | 0 refills | Status: DC | PRN

## 2021-02-27 NOTE — Progress Notes (Addendum)
°  HPI with pertinent ROS:   CC: Possible ulcer?  HPI: Pleasant 56 year old female presenting today with complaints of a possible ulcer.  She has been having epigastric pain that is described as constant, aggravating, and gnawing for the past month.  She feels hungry all the time but notes that the pain does not get better with eating.  Spicy foods make it worse and she has noted that things like ice cream and milk are not helpful.  She is worried about H. pylori and would like to be tested for that.  She has been taking Prevacid for the past 2 to 3 weeks but 4 days ago switched to Nexium since the Prevacid was not helping.  Denies hematochezia, melena, nausea, vomiting, hematemesis, fever, chills, chest pain, belching.  Chronic constipation but has this under control with the use of daily MiraLAX.  Continues to have difficulty with sleeping.  Never picked up the hydroxyzine or the Ambien that was previously ordered but would like to go ahead and give this a try.  Is requesting to have these resent to the pharmacy.  I reviewed the past medical history, family history, social history, surgical history, and allergies today and no changes were needed.  Please see the problem list section below in epic for further details.   Physical exam:   General: Well Developed, well nourished, and in no acute distress.  Neuro: Alert and oriented x3.  HEENT: Normocephalic, atraumatic.  Skin: Warm and dry. Cardiac: Regular rate and rhythm, no murmurs rubs or gallops, no lower extremity edema.  Respiratory: Clear to auscultation bilaterally. Not using accessory muscles, speaking in full sentences. Abdomen: Soft, epigastric tenderness, nondistended. Bowel sounds + x 4 quadrants. No HSM appreciated.   Impression and Recommendations:    1. Epigastric pain Consider ulcer versus H. pylori.  Unfortunately she has been on a PPI daily for the past 2 to 3 weeks.  Reviewed recommendations for stopping PPI for at least 7  days before testing for H. pylori.  She would like to proceed with this and plans to stop taking Nexium/Prevacid for at least the next 7 days.  Order placed for H. pylori testing at [facility] per patient request.  Discussed avoiding spicy foods and limiting dairy.  Patient aware of emergency signs and symptoms to monitor for that should prompt urgent care/ED evaluation. - H. pylori breath test  2.  Insomnia Retrying hydroxyzine versus Ambien.  Refills of both sent to pharmacy per patient request.  Advised limited use of Ambien.  Return if symptoms worsen or fail to improve. ___________________________________________ Clearnce Sorrel, DNP, APRN, FNP-BC [facility] [place]

## 2021-06-04 IMAGING — MG MM PLC BREAST LOC DEV 1ST LESION INC*R*
8 series · 8 of 8 positions shown · non-contrast
Comparison: Previous exam(s).

CLINICAL DATA: Biopsy proven complex sclerosing lesion in the right
breast. Surgical excision recommended.

EXAM:
MAMMOGRAPHIC GUIDED RADIOACTIVE SEED LOCALIZATION OF THE RIGHT
BREAST

[R CC (1 of 5)]
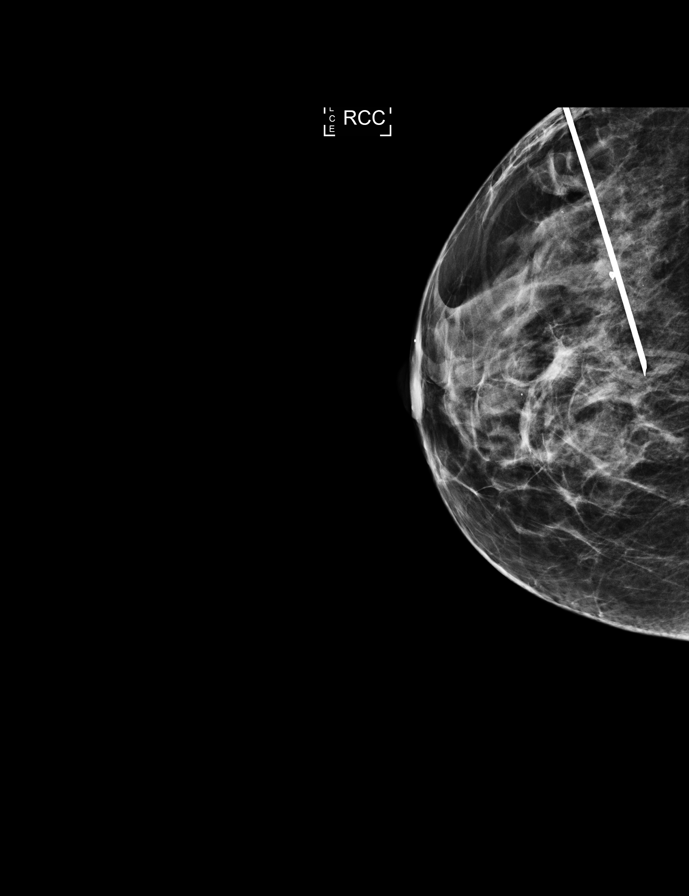

[R CC (2 of 5)]
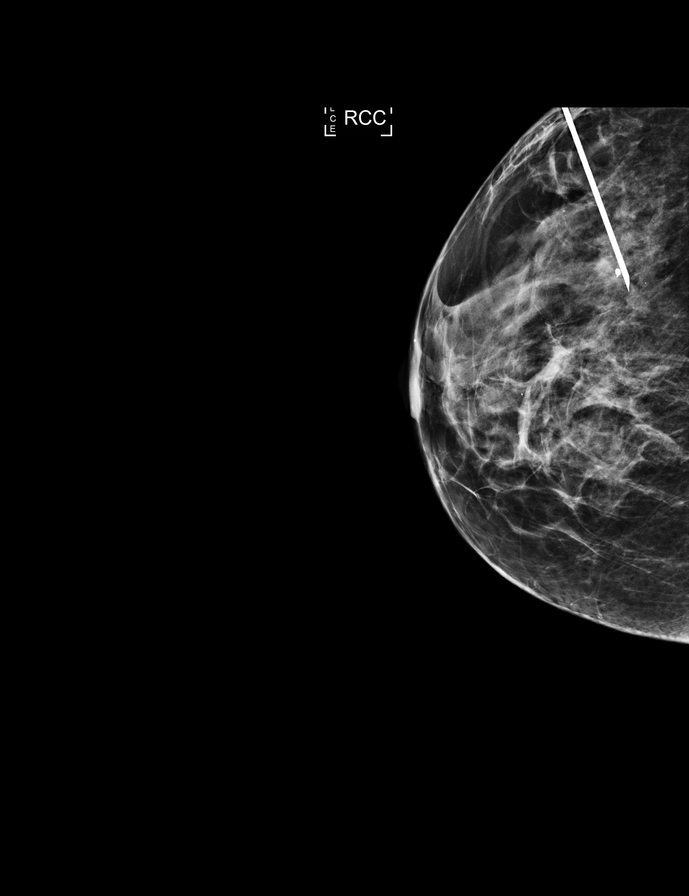

[R LM (1 of 3)]
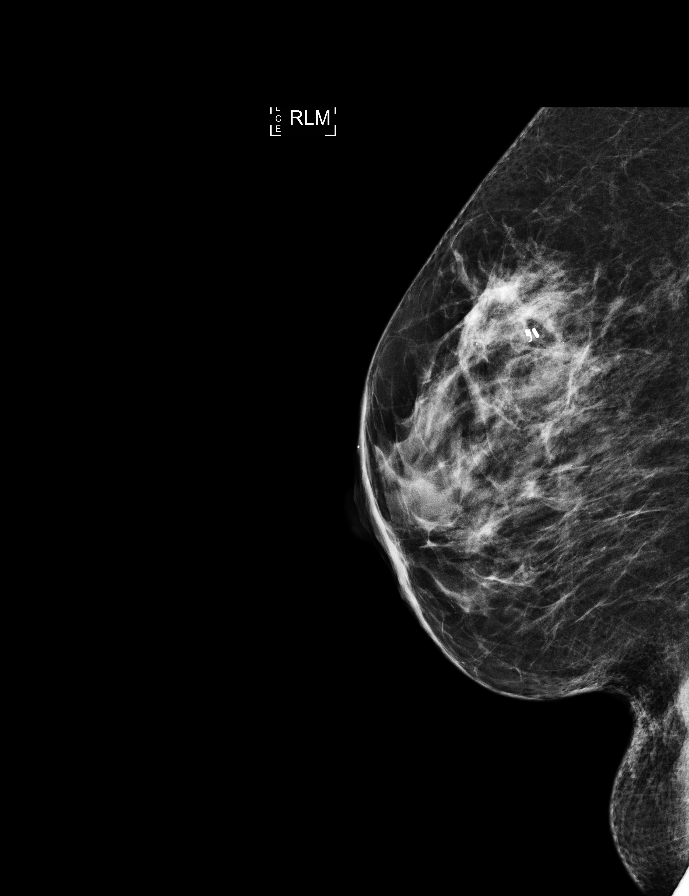

[R LM (2 of 3)]
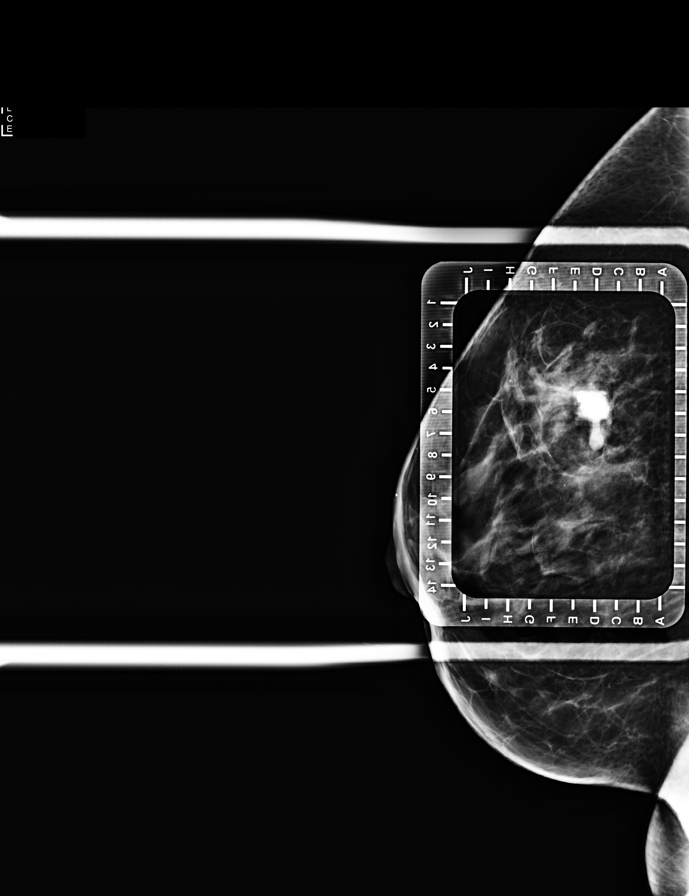

[R LM (3 of 3)]
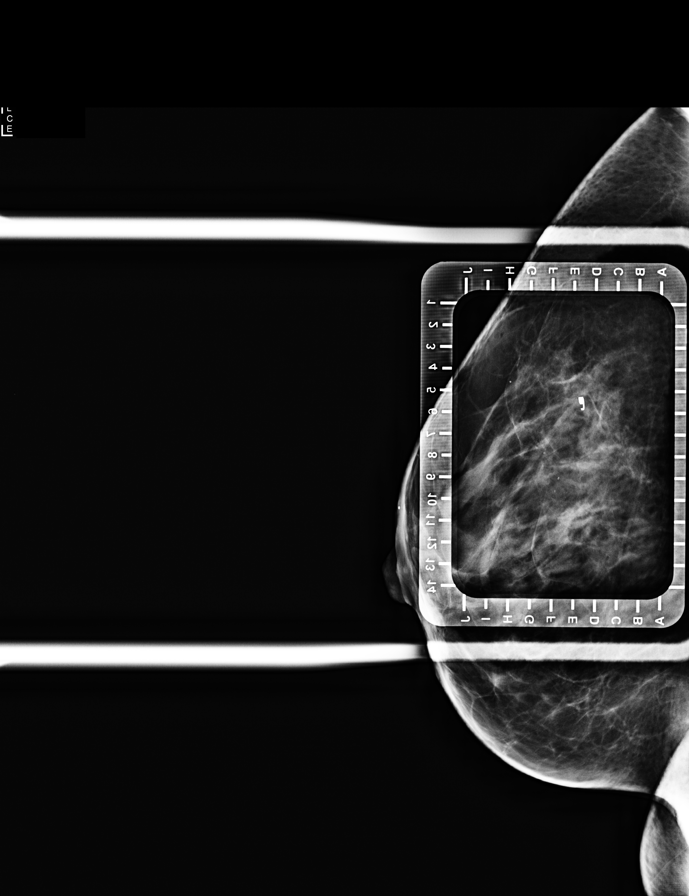

[R CC (3 of 5)]
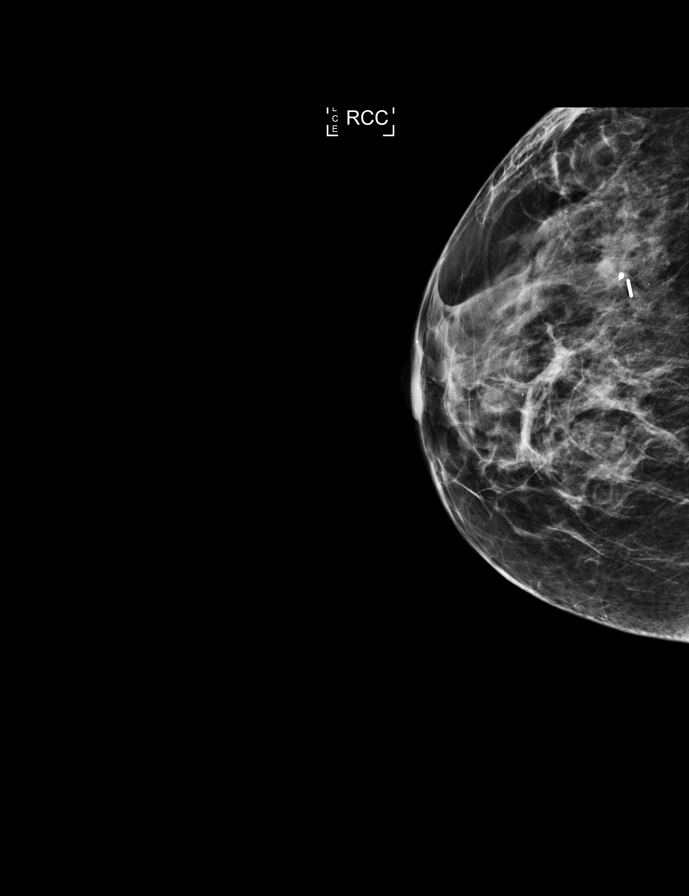

[R CC (4 of 5)]
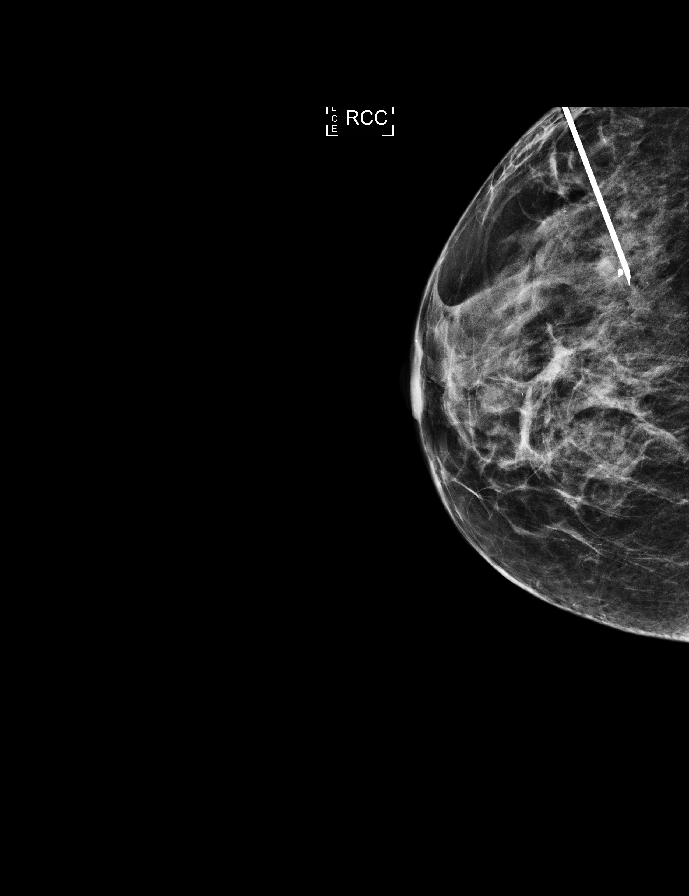

[R CC (5 of 5)]
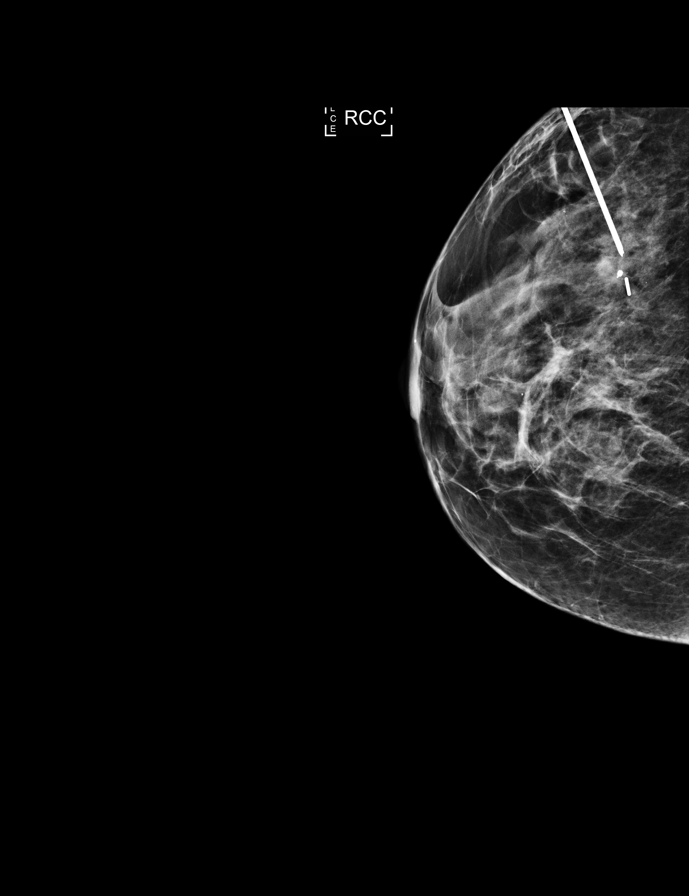

[8 of 8 positions shown; findings below may reference images not displayed]

FINDINGS: Patient presents for radioactive seed localization prior to surgical
excision. I met with the patient and we discussed the procedure of
seed localization including benefits and alternatives. We discussed
the high likelihood of a successful procedure. We discussed the
risks of the procedure including infection, bleeding, tissue injury
and further surgery. We discussed the low dose of radioactivity
involved in the procedure. Informed, written consent was given.

The usual time-out protocol was performed immediately prior to the
procedure.

Using mammographic guidance, sterile technique, 1% lidocaine and an
[id number] radioactive seed, the coil shaped clip was localized using a
lateral to medial approach. The follow-up mammogram images confirm
the seed in the expected location and were marked for Dr. Naridhooge.

Follow-up survey of the patient confirms presence of the radioactive
seed.

Order number of O-8UU seed:  [id number].

Total activity:  0.247 millicuries reference Date: 06/17/2019

The patient tolerated the procedure well and was released from the
[REDACTED]. She was given instructions regarding seed removal.
IMPRESSION: Radioactive seed localization of the right breast. No apparent
complications.

## 2021-06-05 IMAGING — DX MM BREAST SURGICAL SPECIMEN
1 series · 1 of 1 positions shown · non-contrast
Comparison: Previous exam(s).

CLINICAL DATA: Surgical excision of a recently biopsied complex
sclerosing lesion in the upper-outer quadrant of the right breast.

EXAM:
SPECIMEN RADIOGRAPH OF THE RIGHT BREAST

[specimen digital x-ray, derived]
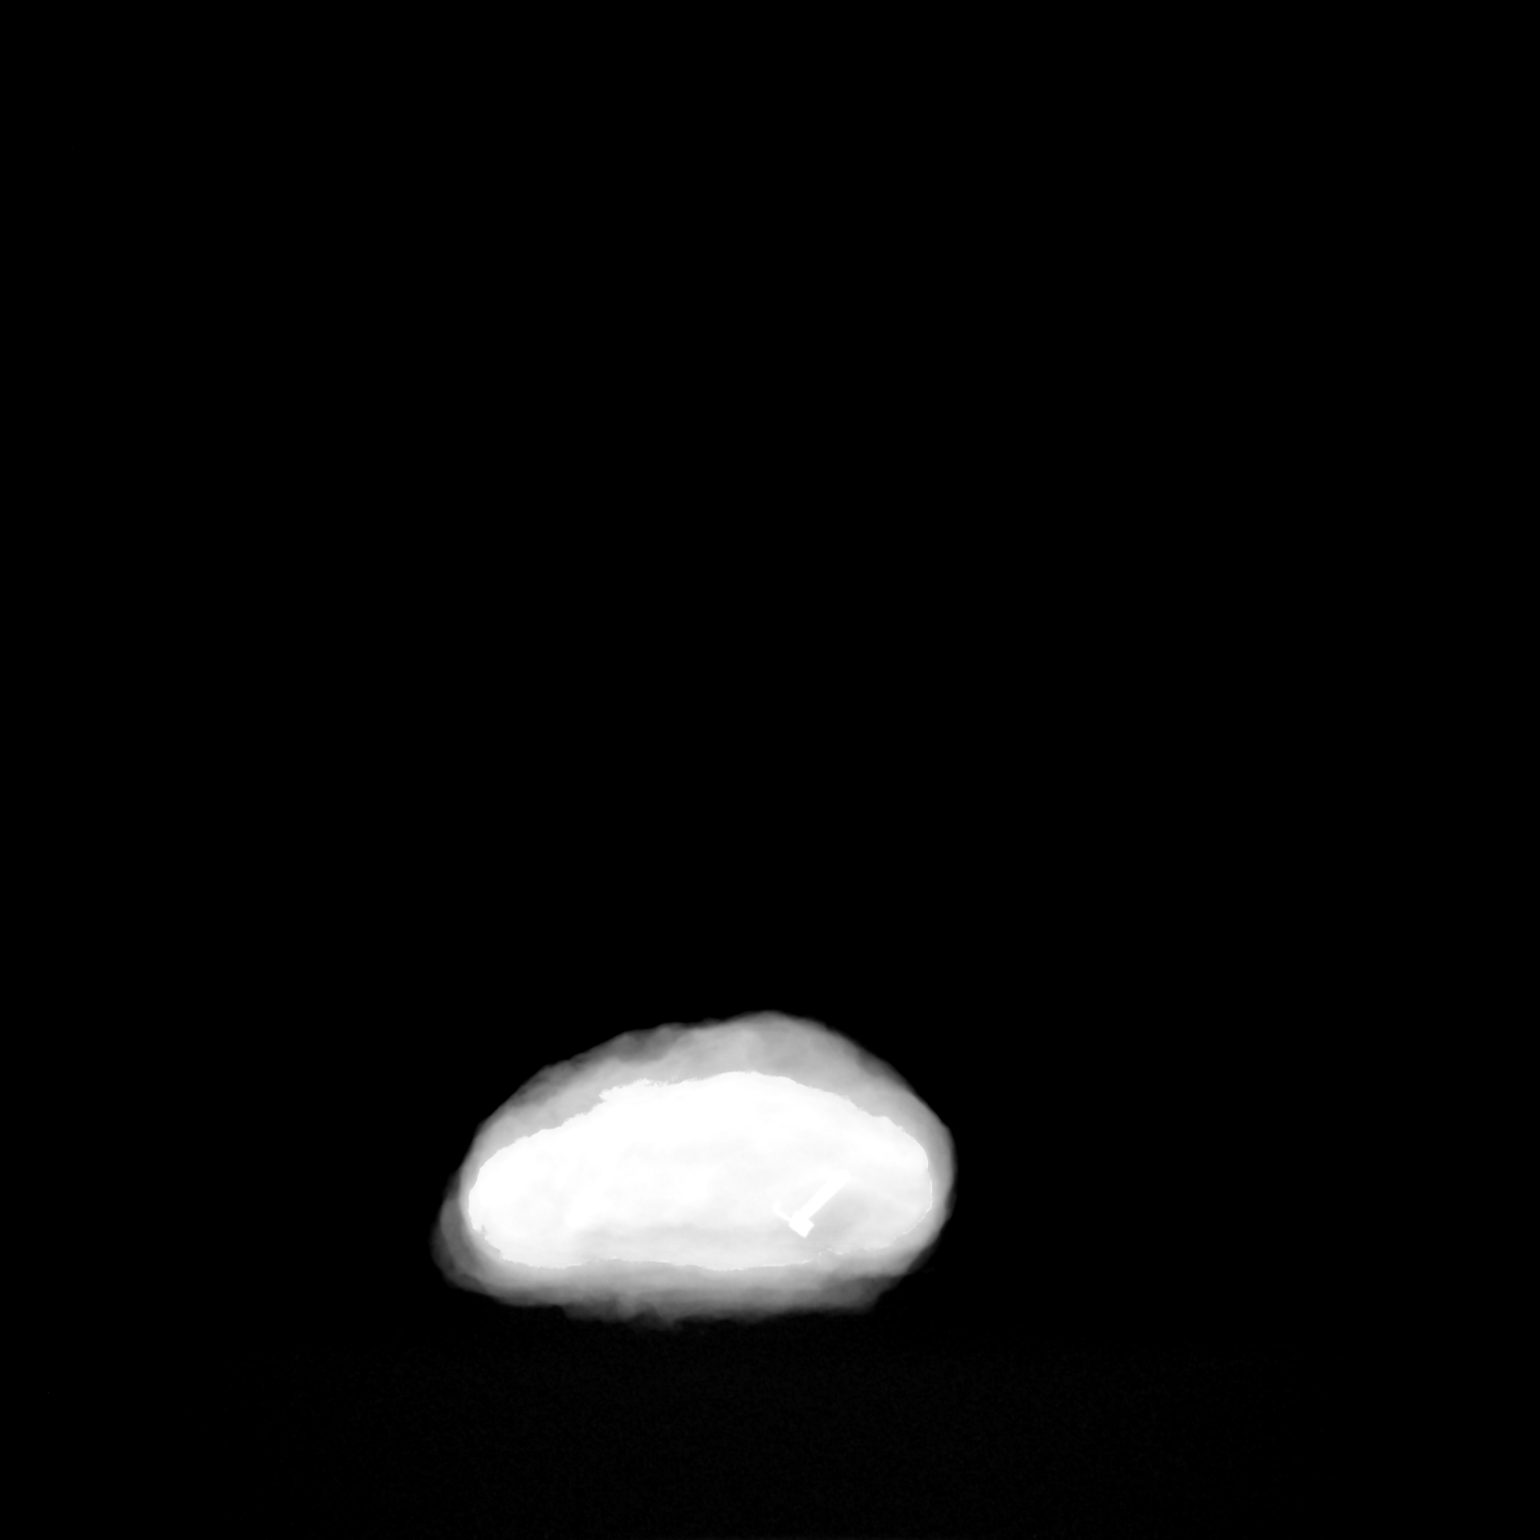

[1 of 1 positions shown; findings below may reference images not displayed]

FINDINGS: Status post excision of the right breast. The radioactive seed and
coil shaped biopsy marker clip are present, completely intact.
IMPRESSION: Specimen radiograph of the right breast.

## 2021-06-28 ENCOUNTER — Other Ambulatory Visit: Payer: Self-pay | Admitting: Medical-Surgical

## 2021-06-28 NOTE — Telephone Encounter (Signed)
Patient is scheduled   

## 2021-06-28 NOTE — Telephone Encounter (Signed)
Patient due for follow up. Please contact her to get her scheduled for follow up on insomnia.

## 2021-08-13 ENCOUNTER — Ambulatory Visit: Payer: Managed Care, Other (non HMO) | Admitting: Medical-Surgical

## 2022-03-01 NOTE — Progress Notes (Unsigned)
Last Mammogram: 10/05/20- negative Last Pap Smear:  10/05/20- negative Last Colon Screening;  cologuard 2022 Seat Belts:   yes  Sun Screen:   yes Dental Check Up:  yes Brush & Floss:  yes

## 2022-03-04 ENCOUNTER — Other Ambulatory Visit: Payer: Self-pay | Admitting: *Deleted

## 2022-03-04 ENCOUNTER — Ambulatory Visit (INDEPENDENT_AMBULATORY_CARE_PROVIDER_SITE_OTHER): Payer: Managed Care, Other (non HMO) | Admitting: Obstetrics & Gynecology

## 2022-03-04 ENCOUNTER — Encounter: Payer: Self-pay | Admitting: Obstetrics & Gynecology

## 2022-03-04 VITALS — BP 113/64 | HR 75 | Ht 62.5 in | Wt 165.0 lb

## 2022-03-04 DIAGNOSIS — Z01419 Encounter for gynecological examination (general) (routine) without abnormal findings: Secondary | ICD-10-CM | POA: Diagnosis not present

## 2022-03-04 DIAGNOSIS — N951 Menopausal and female climacteric states: Secondary | ICD-10-CM

## 2022-03-04 DIAGNOSIS — G4700 Insomnia, unspecified: Secondary | ICD-10-CM | POA: Diagnosis not present

## 2022-03-04 MED ORDER — PROGESTERONE MICRONIZED 100 MG PO CAPS: 100.0000 mg | ORAL_CAPSULE | Freq: Every day | ORAL | 5 refills | Status: DC

## 2022-03-04 MED ORDER — ESTRADIOL 0.025 MG/24HR TD PTTW: 1.0000 | MEDICATED_PATCH | TRANSDERMAL | 5 refills | Status: DC

## 2022-03-04 MED ORDER — TRAZODONE HCL 50 MG PO TABS: ORAL_TABLET | ORAL | 1 refills | Status: AC

## 2022-03-04 NOTE — Progress Notes (Unsigned)
Subjective:     Catherine Cantrell is a 57 y.o. female here for a routine exam.  Current complaints: hot flashes, brain fog, shoulder pain and decrease ROM, trouble sleeping.  Had previously declined HRT and now she is ready.    Gynecologic History Patient's last menstrual period was 10/10/2017. Contraception: post menopausal status Last Mammogram: 10/05/20- negative Last Pap Smear:  10/05/20- negative Last Colon Screening;  negative in 2022 Seat Belts:   yes           Sun Screen:   yes Dental Check Up:  yes Brush & Floss:  yes   Obstetric History OB History  Gravida Para Term Preterm AB Living  3 2     1 2  $ SAB IAB Ectopic Multiple Live Births    1          # Outcome Date GA Lbr Len/2nd Weight Sex Delivery Anes PTL Lv  3 Para           2 Para           1 IAB              The following portions of the patient's history were reviewed and updated as appropriate: allergies, current medications, past family history, past medical history, past social history, past surgical history, and problem list.  Review of Systems Pertinent items noted in HPI and remainder of comprehensive ROS otherwise negative.    Objective:     Vitals:   03/04/22 1354  BP: 113/64  Pulse: 75  Weight: 165 lb (74.8 kg)  Height: 5' 2.5" (1.588 m)   Vitals:  WNL General appearance: alert, cooperative and no distress  HEENT: Normocephalic, without obvious abnormality, atraumatic Eyes: negative Throat: lips, mucosa, and tongue normal; teeth and gums normal  Respiratory: Clear to auscultation bilaterally  CV: Regular rate and rhythm  Breasts:  Normal appearance, no masses or tenderness, no nipple retraction or dimpling  GI: Soft, non-tender; bowel sounds normal; no masses,  no organomegaly  GU: External Genitalia:  Tanner V, no lesion Urethra:  No prolapse   Vagina: Pink, normal rugae, no blood or discharge  Cervix: No CMT, no lesion  Uterus:  Normal size and contour, non tender  Adnexa:  Normal, no masses, non tender  Musculoskeletal: No edema, redness or tenderness in the calves or thighs  Skin: No lesions or rash  Lymphatic: Axillary adenopathy: none     Psychiatric: Normal mood and behavior        Assessment:    Healthy female exam.  Menopausal symptoms Insomnia   Plan:    Pap smear is up to date Colon screening in 2022 Start HRT (counseling about risks, benefits, and alternative), Vivelle dot, Prometrium. Yearly mammograms Insomnia--trazodone 25-200 mg titrate up as needed.

## 2022-03-15 ENCOUNTER — Encounter: Payer: Managed Care, Other (non HMO) | Admitting: Medical-Surgical

## 2022-03-26 ENCOUNTER — Other Ambulatory Visit: Payer: Self-pay | Admitting: Obstetrics & Gynecology

## 2022-03-31 ENCOUNTER — Other Ambulatory Visit: Payer: Self-pay | Admitting: Obstetrics & Gynecology

## 2022-08-26 ENCOUNTER — Other Ambulatory Visit: Payer: Self-pay | Admitting: Obstetrics & Gynecology

## 2022-09-09 ENCOUNTER — Other Ambulatory Visit: Payer: Self-pay | Admitting: Obstetrics & Gynecology

## 2022-09-10 ENCOUNTER — Other Ambulatory Visit: Payer: Self-pay

## 2022-09-10 DIAGNOSIS — N951 Menopausal and female climacteric states: Secondary | ICD-10-CM

## 2022-09-10 MED ORDER — ESTRADIOL 0.025 MG/24HR TD PTTW: 1.0000 | MEDICATED_PATCH | TRANSDERMAL | 0 refills | Status: DC

## 2022-09-10 MED ORDER — PROGESTERONE MICRONIZED 100 MG PO CAPS
100.0000 mg | ORAL_CAPSULE | Freq: Every day | ORAL | 0 refills | Status: DC
Start: 2022-09-10 — End: 2023-10-23

## 2022-09-10 NOTE — Progress Notes (Signed)
Pt needed prescriptions sent as 90 day supply due to insurance

## 2023-06-25 ENCOUNTER — Other Ambulatory Visit: Payer: Self-pay | Admitting: *Deleted

## 2023-06-25 DIAGNOSIS — N951 Menopausal and female climacteric states: Secondary | ICD-10-CM

## 2023-06-25 MED ORDER — ESTRADIOL 0.025 MG/24HR TD PTTW
1.0000 | MEDICATED_PATCH | TRANSDERMAL | 0 refills | Status: DC
Start: 2023-06-26 — End: 2023-10-23

## 2023-10-23 ENCOUNTER — Encounter: Payer: Self-pay | Admitting: Obstetrics and Gynecology

## 2023-10-23 ENCOUNTER — Ambulatory Visit (INDEPENDENT_AMBULATORY_CARE_PROVIDER_SITE_OTHER): Admitting: Obstetrics and Gynecology

## 2023-10-23 ENCOUNTER — Other Ambulatory Visit (HOSPITAL_COMMUNITY)
Admission: RE | Admit: 2023-10-23 | Discharge: 2023-10-23 | Disposition: A | Discharge status: Home / Self Care | Source: Ambulatory Visit | Attending: Obstetrics and Gynecology | Admitting: Obstetrics and Gynecology

## 2023-10-23 VITALS — BP 133/82 | HR 87 | Ht 62.0 in | Wt 172.0 lb

## 2023-10-23 DIAGNOSIS — Z124 Encounter for screening for malignant neoplasm of cervix: Secondary | ICD-10-CM | POA: Insufficient documentation

## 2023-10-23 DIAGNOSIS — Z01419 Encounter for gynecological examination (general) (routine) without abnormal findings: Secondary | ICD-10-CM | POA: Diagnosis present

## 2023-10-23 DIAGNOSIS — N951 Menopausal and female climacteric states: Secondary | ICD-10-CM | POA: Diagnosis not present

## 2023-10-23 DIAGNOSIS — Z720 Tobacco use: Secondary | ICD-10-CM

## 2023-10-23 DIAGNOSIS — Z1151 Encounter for screening for human papillomavirus (HPV): Secondary | ICD-10-CM | POA: Diagnosis not present

## 2023-10-23 MED ORDER — PROGESTERONE MICRONIZED 100 MG PO CAPS
100.0000 mg | ORAL_CAPSULE | Freq: Every day | ORAL | 3 refills | Status: AC
Start: 2023-10-23 — End: ?

## 2023-10-23 MED ORDER — ESTRADIOL 0.0375 MG/24HR TD PTTW: 1.0000 | MEDICATED_PATCH | TRANSDERMAL | 3 refills | Status: AC

## 2023-10-23 NOTE — Patient Instructions (Signed)
 It was nice meeting you today. You will see your results in the MyChart app within 1 week.

## 2023-10-23 NOTE — Progress Notes (Signed)
 ANNUAL EXAM Patient name: Catherine Cantrell MRN [id number]  Date of birth: Feb 12, 1965 Chief Complaint:   Gynecologic Exam (Pt reported would like to discuss fluctuations in weight and hormones, wanting additional labs.)  History of Present Illness:   Catherine Cantrell is a 58 y.o. menopausal G3P2012 being seen today for a routine annual exam.  Current complaints:  - Notes persistent issues with sleep quality, feeling sluggish, weight gain, joint issues. Started MHT with Dr. Cris last year and saw good improvement in hot flashes but not complete resolution. Is interested in increasing dose.  - Works for [employer] and gets free labs. Has panels that she would like completed for healthcare screening and understands they may not be covered by insurance. Reviewed that we would not change hormone therapy dosing based on hormone levels - Currently vapes, prior 30 year history of smoking 1ppd  Last pap 10/05/2020. Results were: NILM w/ HRHPV negative. H/O abnormal pap: yes +HPV 2019 & 2020 Last mammogram: 2022. Results abnormal and  Last colonoscopy: Doing cologuard, is due but has kit at home DEXA: FRAX score 6%, does not qualify for early DEXA at this time     10/23/2023    4:36 PM 11/10/2020    1:52 PM 11/09/2019    3:14 PM 08/25/2018   10:43 AM  Depression screen PHQ 2/9  Decreased Interest 0 0 0 0  Down, Depressed, Hopeless 0 0 0 0  PHQ - 2 Score 0 0 0 0  Altered sleeping 3     Tired, decreased energy 2     Change in appetite 0     Feeling bad or failure about yourself  0     Trouble concentrating 0     Moving slowly or fidgety/restless 0     Suicidal thoughts 0     PHQ-9 Score 5           10/23/2023    4:36 PM  GAD 7 : Generalized Anxiety Score  Nervous, Anxious, on Edge 0  Control/stop worrying 1  Worry too much - different things 1  Trouble relaxing 1  Restless 0  Easily annoyed or irritable 0  Afraid - awful might happen 0  Total GAD 7 Score 3    Review  of Systems:   Pertinent items are noted in HPI Denies any headaches, blurred vision, fatigue, shortness of breath, chest pain, abdominal pain, abnormal vaginal discharge/itching/odor/irritation, problems with periods, bowel movements, urination, or intercourse unless otherwise stated above. Pertinent History Reviewed:  Reviewed past medical,surgical, social and family history.  Reviewed problem list, medications and allergies. Physical Assessment:   Vitals:   10/23/23 1622  BP: 133/82  Pulse: 87  Weight: 172 lb (78 kg)  Height: 5' 2 (1.575 m)  Body mass index is 31.46 kg/m.        Physical Examination:   General appearance - well appearing, and in no distress  Mental status - alert, oriented to person, place, and time  Chest - respiratory effort normal  Heart - normal peripheral perfusion  Breasts - breasts appear normal, no suspicious masses, no skin or nipple changes or axillary nodes  Abdomen - soft, nontender, nondistended, no masses or organomegaly  Pelvic - VULVA: normal appearing vulva with no masses, tenderness or lesions  VAGINA: normal appearing vagina with normal color and discharge, no lesions  CERVIX: normal appearing cervix without discharge or lesions, no CMT  Thin prep pap is done with HR HPV cotesting  UTERUS: uterus is felt  to be normal size, shape, consistency and nontender   ADNEXA: No adnexal masses or tenderness noted.  Chaperone present for exam  No results found for this or any previous visit (from the past 24 hours).  Assessment & Plan:  1) Well-Woman Exam Mammogram: Mammogram recommended, she declines future mammograms but is willing to get breast ultrasound. Will d/w PCP and/or breast center to see if this is feasible Colonoscopy: Due for cologuard, encouraged to complete kit Pap: Collected today GC/CT/HIV/HCV: Up to date, not indicated this visit Low dose CT ordered for lung cancer screening (30 pack year history, quit 8 years ago & now  vaping) Declines all vaccines  2) Menopause - Discussed limitations of MHT at addressing her symptoms of concern, but that would could try increasing estradiol  dose to get further control of vasomotor symptoms. Reviewed potential non-hormonal options that may address more of the mood/sleep concerns. Also reviewed why topical E/P creams and pellets are not currently recommended & why vaginal estrogen may not improve her symptoms of concern.  - She would like to try dose adjustment before trying non-hormonal options -     progesterone  (PROMETRIUM ) 100 MG capsule; Take 1 capsule (100 mg total) by mouth daily -     estradiol  (VIVELLE -DOT) 0.0375 MG/24HR; Place 1 patch onto the skin 2 (two) times a week.  Labs/procedures today:  Orders Placed This Encounter  Procedures   Low Dose CT Chest w/o Contrast for Lung Cancer Screening [PFH4422]   CBC w/Diff   Comp Met (CMET)   Hgb A1c w/o eAG   Insulin, random   Lipid Panel w/o Chol/HDL Ratio   CRP High sensitivity   T3, free   TSH + free T4   Homocysteine   Ferritin   B12 and Folate Panel   Iron and TIBC   DHEA-sulfate   Estradiol    Testosterone, Total, LC/MS/MS   FSH/LH   Cortisol   Uric acid   Progesterone    T3, reverse   Thyroid peroxidase antibody   Prolactin   Vitamin A   Vitamin D  (25 hydroxy)   Vitamin E   Iodine, Serum/Plasma   Magnesium   Zinc   NMR LipoProf + Graph   Lipoprotein A (LPA)   Apolipoprotein B   GLYCA   Meds:  Meds ordered this encounter  Medications   progesterone  (PROMETRIUM ) 100 MG capsule    Sig: Take 1 capsule (100 mg total) by mouth daily.    Dispense:  90 capsule    Refill:  3   estradiol  (VIVELLE -DOT) 0.0375 MG/24HR    Sig: Place 1 patch onto the skin 2 (two) times a week.    Dispense:  24 patch    Refill:  3   Follow-up: Return in about 1 year (around 10/22/2024) for annual exam or sooner as needed.  Kieth JAYSON Carolin, MD 10/23/2023 5:17 PM

## 2023-10-23 NOTE — Addendum Note (Signed)
 Addended by: ELODIE NEST T on: 10/23/2023 05:39 PM   Modules accepted: Orders

## 2023-10-27 ENCOUNTER — Ambulatory Visit: Payer: Self-pay | Admitting: Obstetrics and Gynecology

## 2023-10-27 LAB — CYTOLOGY - PAP
Comment: NEGATIVE
Diagnosis: NEGATIVE
High risk HPV: NEGATIVE

## 2023-10-27 NOTE — Addendum Note (Signed)
 Addended by: ERIK FELTS on: 10/27/2023 09:36 AM   Modules accepted: Orders
